# Patient Record
Sex: Male | Born: 1950
Health system: Southern US, Community
[De-identification: ages and names within clinical notes are randomized; demographics above are authoritative.]

## PROBLEM LIST (undated history)

## (undated) DIAGNOSIS — M1990 Unspecified osteoarthritis, unspecified site: Secondary | ICD-10-CM

## (undated) DIAGNOSIS — I10 Essential (primary) hypertension: Secondary | ICD-10-CM

## (undated) DIAGNOSIS — N183 Chronic kidney disease, stage 3 unspecified: Secondary | ICD-10-CM

## (undated) DIAGNOSIS — Z87442 Personal history of urinary calculi: Secondary | ICD-10-CM

## (undated) DIAGNOSIS — I2510 Atherosclerotic heart disease of native coronary artery without angina pectoris: Secondary | ICD-10-CM

## (undated) DIAGNOSIS — K219 Gastro-esophageal reflux disease without esophagitis: Secondary | ICD-10-CM

## (undated) DIAGNOSIS — R7301 Impaired fasting glucose: Secondary | ICD-10-CM

## (undated) DIAGNOSIS — F419 Anxiety disorder, unspecified: Secondary | ICD-10-CM

## (undated) HISTORY — PX: HERNIA REPAIR: SHX51

## (undated) HISTORY — PX: CHOLECYSTECTOMY: SHX55

## (undated) HISTORY — PX: CARDIAC CATHETERIZATION: SHX172

---

## 2017-07-18 DIAGNOSIS — L723 Sebaceous cyst: Secondary | ICD-10-CM | POA: Diagnosis not present

## 2017-07-18 DIAGNOSIS — L602 Onychogryphosis: Secondary | ICD-10-CM | POA: Diagnosis not present

## 2017-07-18 DIAGNOSIS — B353 Tinea pedis: Secondary | ICD-10-CM | POA: Diagnosis not present

## 2017-07-18 DIAGNOSIS — M79672 Pain in left foot: Secondary | ICD-10-CM | POA: Diagnosis not present

## 2017-07-18 DIAGNOSIS — M79671 Pain in right foot: Secondary | ICD-10-CM | POA: Diagnosis not present

## 2017-07-30 DIAGNOSIS — M1000 Idiopathic gout, unspecified site: Secondary | ICD-10-CM | POA: Diagnosis not present

## 2017-07-30 DIAGNOSIS — M1612 Unilateral primary osteoarthritis, left hip: Secondary | ICD-10-CM | POA: Diagnosis not present

## 2017-07-30 DIAGNOSIS — E782 Mixed hyperlipidemia: Secondary | ICD-10-CM | POA: Diagnosis not present

## 2017-07-30 DIAGNOSIS — R7301 Impaired fasting glucose: Secondary | ICD-10-CM | POA: Diagnosis not present

## 2017-07-30 DIAGNOSIS — I10 Essential (primary) hypertension: Secondary | ICD-10-CM | POA: Diagnosis not present

## 2017-07-30 DIAGNOSIS — K210 Gastro-esophageal reflux disease with esophagitis: Secondary | ICD-10-CM | POA: Diagnosis not present

## 2017-07-30 DIAGNOSIS — N183 Chronic kidney disease, stage 3 (moderate): Secondary | ICD-10-CM | POA: Diagnosis not present

## 2017-07-30 DIAGNOSIS — Z8711 Personal history of peptic ulcer disease: Secondary | ICD-10-CM | POA: Diagnosis not present

## 2017-08-01 DIAGNOSIS — M1000 Idiopathic gout, unspecified site: Secondary | ICD-10-CM | POA: Diagnosis not present

## 2017-08-01 DIAGNOSIS — F411 Generalized anxiety disorder: Secondary | ICD-10-CM | POA: Diagnosis not present

## 2017-08-01 DIAGNOSIS — Z1331 Encounter for screening for depression: Secondary | ICD-10-CM | POA: Diagnosis not present

## 2017-08-01 DIAGNOSIS — Z6824 Body mass index (BMI) 24.0-24.9, adult: Secondary | ICD-10-CM | POA: Diagnosis not present

## 2017-08-01 DIAGNOSIS — N401 Enlarged prostate with lower urinary tract symptoms: Secondary | ICD-10-CM | POA: Diagnosis not present

## 2017-08-01 DIAGNOSIS — Z1389 Encounter for screening for other disorder: Secondary | ICD-10-CM | POA: Diagnosis not present

## 2017-08-01 DIAGNOSIS — Z23 Encounter for immunization: Secondary | ICD-10-CM | POA: Diagnosis not present

## 2017-08-01 DIAGNOSIS — E782 Mixed hyperlipidemia: Secondary | ICD-10-CM | POA: Diagnosis not present

## 2017-08-01 DIAGNOSIS — I10 Essential (primary) hypertension: Secondary | ICD-10-CM | POA: Diagnosis not present

## 2017-08-06 DIAGNOSIS — L821 Other seborrheic keratosis: Secondary | ICD-10-CM | POA: Diagnosis not present

## 2017-08-06 DIAGNOSIS — L570 Actinic keratosis: Secondary | ICD-10-CM | POA: Diagnosis not present

## 2017-08-06 DIAGNOSIS — L738 Other specified follicular disorders: Secondary | ICD-10-CM | POA: Diagnosis not present

## 2017-08-28 DIAGNOSIS — N401 Enlarged prostate with lower urinary tract symptoms: Secondary | ICD-10-CM | POA: Diagnosis not present

## 2017-08-28 DIAGNOSIS — Z87442 Personal history of urinary calculi: Secondary | ICD-10-CM | POA: Diagnosis not present

## 2017-08-28 DIAGNOSIS — R351 Nocturia: Secondary | ICD-10-CM | POA: Diagnosis not present

## 2017-08-28 DIAGNOSIS — R3914 Feeling of incomplete bladder emptying: Secondary | ICD-10-CM | POA: Diagnosis not present

## 2017-09-19 DIAGNOSIS — M79672 Pain in left foot: Secondary | ICD-10-CM | POA: Diagnosis not present

## 2017-09-19 DIAGNOSIS — B353 Tinea pedis: Secondary | ICD-10-CM | POA: Diagnosis not present

## 2017-09-19 DIAGNOSIS — M79671 Pain in right foot: Secondary | ICD-10-CM | POA: Diagnosis not present

## 2017-09-19 DIAGNOSIS — L602 Onychogryphosis: Secondary | ICD-10-CM | POA: Diagnosis not present

## 2017-09-19 DIAGNOSIS — L723 Sebaceous cyst: Secondary | ICD-10-CM | POA: Diagnosis not present

## 2017-12-05 DIAGNOSIS — M79672 Pain in left foot: Secondary | ICD-10-CM | POA: Diagnosis not present

## 2017-12-05 DIAGNOSIS — L723 Sebaceous cyst: Secondary | ICD-10-CM | POA: Diagnosis not present

## 2017-12-05 DIAGNOSIS — M79671 Pain in right foot: Secondary | ICD-10-CM | POA: Diagnosis not present

## 2017-12-05 DIAGNOSIS — L602 Onychogryphosis: Secondary | ICD-10-CM | POA: Diagnosis not present

## 2017-12-05 DIAGNOSIS — B353 Tinea pedis: Secondary | ICD-10-CM | POA: Diagnosis not present

## 2017-12-27 DIAGNOSIS — H43812 Vitreous degeneration, left eye: Secondary | ICD-10-CM | POA: Diagnosis not present

## 2017-12-27 DIAGNOSIS — H2513 Age-related nuclear cataract, bilateral: Secondary | ICD-10-CM | POA: Diagnosis not present

## 2017-12-31 DIAGNOSIS — L821 Other seborrheic keratosis: Secondary | ICD-10-CM | POA: Diagnosis not present

## 2017-12-31 DIAGNOSIS — L570 Actinic keratosis: Secondary | ICD-10-CM | POA: Diagnosis not present

## 2017-12-31 DIAGNOSIS — D1801 Hemangioma of skin and subcutaneous tissue: Secondary | ICD-10-CM | POA: Diagnosis not present

## 2017-12-31 DIAGNOSIS — L814 Other melanin hyperpigmentation: Secondary | ICD-10-CM | POA: Diagnosis not present

## 2018-02-04 DIAGNOSIS — E782 Mixed hyperlipidemia: Secondary | ICD-10-CM | POA: Diagnosis not present

## 2018-02-04 DIAGNOSIS — R7301 Impaired fasting glucose: Secondary | ICD-10-CM | POA: Diagnosis not present

## 2018-02-04 DIAGNOSIS — I10 Essential (primary) hypertension: Secondary | ICD-10-CM | POA: Diagnosis not present

## 2018-02-04 DIAGNOSIS — K210 Gastro-esophageal reflux disease with esophagitis: Secondary | ICD-10-CM | POA: Diagnosis not present

## 2018-02-04 DIAGNOSIS — M1000 Idiopathic gout, unspecified site: Secondary | ICD-10-CM | POA: Diagnosis not present

## 2018-02-04 DIAGNOSIS — N183 Chronic kidney disease, stage 3 (moderate): Secondary | ICD-10-CM | POA: Diagnosis not present

## 2018-02-06 DIAGNOSIS — M1000 Idiopathic gout, unspecified site: Secondary | ICD-10-CM | POA: Diagnosis not present

## 2018-02-06 DIAGNOSIS — Z6824 Body mass index (BMI) 24.0-24.9, adult: Secondary | ICD-10-CM | POA: Diagnosis not present

## 2018-02-06 DIAGNOSIS — N401 Enlarged prostate with lower urinary tract symptoms: Secondary | ICD-10-CM | POA: Diagnosis not present

## 2018-02-06 DIAGNOSIS — E782 Mixed hyperlipidemia: Secondary | ICD-10-CM | POA: Diagnosis not present

## 2018-02-06 DIAGNOSIS — I10 Essential (primary) hypertension: Secondary | ICD-10-CM | POA: Diagnosis not present

## 2018-02-06 DIAGNOSIS — N183 Chronic kidney disease, stage 3 (moderate): Secondary | ICD-10-CM | POA: Diagnosis not present

## 2018-02-06 DIAGNOSIS — Z23 Encounter for immunization: Secondary | ICD-10-CM | POA: Diagnosis not present

## 2018-02-06 DIAGNOSIS — M6530 Trigger finger, unspecified finger: Secondary | ICD-10-CM | POA: Diagnosis not present

## 2018-02-13 DIAGNOSIS — M79672 Pain in left foot: Secondary | ICD-10-CM | POA: Diagnosis not present

## 2018-02-13 DIAGNOSIS — L84 Corns and callosities: Secondary | ICD-10-CM | POA: Diagnosis not present

## 2018-02-13 DIAGNOSIS — L723 Sebaceous cyst: Secondary | ICD-10-CM | POA: Diagnosis not present

## 2018-02-13 DIAGNOSIS — B353 Tinea pedis: Secondary | ICD-10-CM | POA: Diagnosis not present

## 2018-02-13 DIAGNOSIS — L602 Onychogryphosis: Secondary | ICD-10-CM | POA: Diagnosis not present

## 2018-02-13 DIAGNOSIS — M79671 Pain in right foot: Secondary | ICD-10-CM | POA: Diagnosis not present

## 2018-03-05 DIAGNOSIS — R351 Nocturia: Secondary | ICD-10-CM | POA: Diagnosis not present

## 2018-03-05 DIAGNOSIS — Z87442 Personal history of urinary calculi: Secondary | ICD-10-CM | POA: Diagnosis not present

## 2018-03-05 DIAGNOSIS — Z6825 Body mass index (BMI) 25.0-25.9, adult: Secondary | ICD-10-CM | POA: Diagnosis not present

## 2018-03-05 DIAGNOSIS — N401 Enlarged prostate with lower urinary tract symptoms: Secondary | ICD-10-CM | POA: Diagnosis not present

## 2018-05-08 DIAGNOSIS — L84 Corns and callosities: Secondary | ICD-10-CM | POA: Diagnosis not present

## 2018-05-08 DIAGNOSIS — M79672 Pain in left foot: Secondary | ICD-10-CM | POA: Diagnosis not present

## 2018-05-08 DIAGNOSIS — L723 Sebaceous cyst: Secondary | ICD-10-CM | POA: Diagnosis not present

## 2018-05-08 DIAGNOSIS — M79671 Pain in right foot: Secondary | ICD-10-CM | POA: Diagnosis not present

## 2018-05-08 DIAGNOSIS — B353 Tinea pedis: Secondary | ICD-10-CM | POA: Diagnosis not present

## 2018-05-08 DIAGNOSIS — L602 Onychogryphosis: Secondary | ICD-10-CM | POA: Diagnosis not present

## 2018-07-01 DIAGNOSIS — D1801 Hemangioma of skin and subcutaneous tissue: Secondary | ICD-10-CM | POA: Diagnosis not present

## 2018-07-01 DIAGNOSIS — H61002 Unspecified perichondritis of left external ear: Secondary | ICD-10-CM | POA: Diagnosis not present

## 2018-07-01 DIAGNOSIS — B360 Pityriasis versicolor: Secondary | ICD-10-CM | POA: Diagnosis not present

## 2018-07-01 DIAGNOSIS — D225 Melanocytic nevi of trunk: Secondary | ICD-10-CM | POA: Diagnosis not present

## 2018-07-01 DIAGNOSIS — B351 Tinea unguium: Secondary | ICD-10-CM | POA: Diagnosis not present

## 2018-07-01 DIAGNOSIS — L821 Other seborrheic keratosis: Secondary | ICD-10-CM | POA: Diagnosis not present

## 2018-07-01 DIAGNOSIS — L570 Actinic keratosis: Secondary | ICD-10-CM | POA: Diagnosis not present

## 2018-07-15 DIAGNOSIS — L820 Inflamed seborrheic keratosis: Secondary | ICD-10-CM | POA: Diagnosis not present

## 2018-07-26 DIAGNOSIS — L602 Onychogryphosis: Secondary | ICD-10-CM | POA: Diagnosis not present

## 2018-07-26 DIAGNOSIS — L723 Sebaceous cyst: Secondary | ICD-10-CM | POA: Diagnosis not present

## 2018-07-26 DIAGNOSIS — L84 Corns and callosities: Secondary | ICD-10-CM | POA: Diagnosis not present

## 2018-07-26 DIAGNOSIS — B353 Tinea pedis: Secondary | ICD-10-CM | POA: Diagnosis not present

## 2018-07-26 DIAGNOSIS — M79672 Pain in left foot: Secondary | ICD-10-CM | POA: Diagnosis not present

## 2018-07-26 DIAGNOSIS — M79671 Pain in right foot: Secondary | ICD-10-CM | POA: Diagnosis not present

## 2018-09-02 DIAGNOSIS — M1000 Idiopathic gout, unspecified site: Secondary | ICD-10-CM | POA: Diagnosis not present

## 2018-09-02 DIAGNOSIS — K210 Gastro-esophageal reflux disease with esophagitis: Secondary | ICD-10-CM | POA: Diagnosis not present

## 2018-09-02 DIAGNOSIS — N183 Chronic kidney disease, stage 3 (moderate): Secondary | ICD-10-CM | POA: Diagnosis not present

## 2018-09-02 DIAGNOSIS — E782 Mixed hyperlipidemia: Secondary | ICD-10-CM | POA: Diagnosis not present

## 2018-09-02 DIAGNOSIS — I10 Essential (primary) hypertension: Secondary | ICD-10-CM | POA: Diagnosis not present

## 2018-09-02 DIAGNOSIS — R7301 Impaired fasting glucose: Secondary | ICD-10-CM | POA: Diagnosis not present

## 2018-09-09 DIAGNOSIS — N401 Enlarged prostate with lower urinary tract symptoms: Secondary | ICD-10-CM | POA: Diagnosis not present

## 2018-09-09 DIAGNOSIS — Z0001 Encounter for general adult medical examination with abnormal findings: Secondary | ICD-10-CM | POA: Diagnosis not present

## 2018-09-09 DIAGNOSIS — I10 Essential (primary) hypertension: Secondary | ICD-10-CM | POA: Diagnosis not present

## 2018-09-09 DIAGNOSIS — R7301 Impaired fasting glucose: Secondary | ICD-10-CM | POA: Diagnosis not present

## 2018-09-09 DIAGNOSIS — E782 Mixed hyperlipidemia: Secondary | ICD-10-CM | POA: Diagnosis not present

## 2018-09-09 DIAGNOSIS — N183 Chronic kidney disease, stage 3 (moderate): Secondary | ICD-10-CM | POA: Diagnosis not present

## 2018-09-09 DIAGNOSIS — Z6824 Body mass index (BMI) 24.0-24.9, adult: Secondary | ICD-10-CM | POA: Diagnosis not present

## 2018-09-09 DIAGNOSIS — M1612 Unilateral primary osteoarthritis, left hip: Secondary | ICD-10-CM | POA: Diagnosis not present

## 2018-10-02 DIAGNOSIS — M79671 Pain in right foot: Secondary | ICD-10-CM | POA: Diagnosis not present

## 2018-10-02 DIAGNOSIS — L602 Onychogryphosis: Secondary | ICD-10-CM | POA: Diagnosis not present

## 2018-10-02 DIAGNOSIS — L723 Sebaceous cyst: Secondary | ICD-10-CM | POA: Diagnosis not present

## 2018-10-02 DIAGNOSIS — L600 Ingrowing nail: Secondary | ICD-10-CM | POA: Diagnosis not present

## 2018-10-02 DIAGNOSIS — L84 Corns and callosities: Secondary | ICD-10-CM | POA: Diagnosis not present

## 2018-10-02 DIAGNOSIS — M79672 Pain in left foot: Secondary | ICD-10-CM | POA: Diagnosis not present

## 2018-11-20 DIAGNOSIS — D485 Neoplasm of uncertain behavior of skin: Secondary | ICD-10-CM | POA: Diagnosis not present

## 2018-11-20 DIAGNOSIS — L570 Actinic keratosis: Secondary | ICD-10-CM | POA: Diagnosis not present

## 2018-11-20 DIAGNOSIS — L821 Other seborrheic keratosis: Secondary | ICD-10-CM | POA: Diagnosis not present

## 2018-11-20 DIAGNOSIS — D229 Melanocytic nevi, unspecified: Secondary | ICD-10-CM | POA: Diagnosis not present

## 2018-11-20 DIAGNOSIS — L820 Inflamed seborrheic keratosis: Secondary | ICD-10-CM | POA: Diagnosis not present

## 2018-11-20 DIAGNOSIS — D1801 Hemangioma of skin and subcutaneous tissue: Secondary | ICD-10-CM | POA: Diagnosis not present

## 2018-11-20 DIAGNOSIS — L738 Other specified follicular disorders: Secondary | ICD-10-CM | POA: Diagnosis not present

## 2018-11-20 DIAGNOSIS — L814 Other melanin hyperpigmentation: Secondary | ICD-10-CM | POA: Diagnosis not present

## 2018-12-11 DIAGNOSIS — L723 Sebaceous cyst: Secondary | ICD-10-CM | POA: Diagnosis not present

## 2018-12-11 DIAGNOSIS — M79671 Pain in right foot: Secondary | ICD-10-CM | POA: Diagnosis not present

## 2018-12-11 DIAGNOSIS — B353 Tinea pedis: Secondary | ICD-10-CM | POA: Diagnosis not present

## 2018-12-11 DIAGNOSIS — L600 Ingrowing nail: Secondary | ICD-10-CM | POA: Diagnosis not present

## 2018-12-11 DIAGNOSIS — L602 Onychogryphosis: Secondary | ICD-10-CM | POA: Diagnosis not present

## 2018-12-11 DIAGNOSIS — M79672 Pain in left foot: Secondary | ICD-10-CM | POA: Diagnosis not present

## 2019-02-11 DIAGNOSIS — Z23 Encounter for immunization: Secondary | ICD-10-CM | POA: Diagnosis not present

## 2019-02-12 DIAGNOSIS — L723 Sebaceous cyst: Secondary | ICD-10-CM | POA: Diagnosis not present

## 2019-02-12 DIAGNOSIS — M79672 Pain in left foot: Secondary | ICD-10-CM | POA: Diagnosis not present

## 2019-02-12 DIAGNOSIS — L84 Corns and callosities: Secondary | ICD-10-CM | POA: Diagnosis not present

## 2019-02-12 DIAGNOSIS — L602 Onychogryphosis: Secondary | ICD-10-CM | POA: Diagnosis not present

## 2019-02-12 DIAGNOSIS — L600 Ingrowing nail: Secondary | ICD-10-CM | POA: Diagnosis not present

## 2019-02-12 DIAGNOSIS — M79671 Pain in right foot: Secondary | ICD-10-CM | POA: Diagnosis not present

## 2019-02-24 DIAGNOSIS — M1000 Idiopathic gout, unspecified site: Secondary | ICD-10-CM | POA: Diagnosis not present

## 2019-02-24 DIAGNOSIS — I10 Essential (primary) hypertension: Secondary | ICD-10-CM | POA: Diagnosis not present

## 2019-02-24 DIAGNOSIS — K2100 Gastro-esophageal reflux disease with esophagitis, without bleeding: Secondary | ICD-10-CM | POA: Diagnosis not present

## 2019-02-24 DIAGNOSIS — E782 Mixed hyperlipidemia: Secondary | ICD-10-CM | POA: Diagnosis not present

## 2019-03-03 DIAGNOSIS — I10 Essential (primary) hypertension: Secondary | ICD-10-CM | POA: Diagnosis not present

## 2019-03-03 DIAGNOSIS — Z23 Encounter for immunization: Secondary | ICD-10-CM | POA: Diagnosis not present

## 2019-03-03 DIAGNOSIS — E782 Mixed hyperlipidemia: Secondary | ICD-10-CM | POA: Diagnosis not present

## 2019-03-03 DIAGNOSIS — N183 Chronic kidney disease, stage 3 unspecified: Secondary | ICD-10-CM | POA: Diagnosis not present

## 2019-03-03 DIAGNOSIS — M1000 Idiopathic gout, unspecified site: Secondary | ICD-10-CM | POA: Diagnosis not present

## 2019-03-03 DIAGNOSIS — N401 Enlarged prostate with lower urinary tract symptoms: Secondary | ICD-10-CM | POA: Diagnosis not present

## 2019-03-03 DIAGNOSIS — Z6823 Body mass index (BMI) 23.0-23.9, adult: Secondary | ICD-10-CM | POA: Diagnosis not present

## 2019-03-03 DIAGNOSIS — R109 Unspecified abdominal pain: Secondary | ICD-10-CM | POA: Diagnosis not present

## 2019-03-10 DIAGNOSIS — N400 Enlarged prostate without lower urinary tract symptoms: Secondary | ICD-10-CM | POA: Diagnosis not present

## 2019-03-10 DIAGNOSIS — Z9049 Acquired absence of other specified parts of digestive tract: Secondary | ICD-10-CM | POA: Diagnosis not present

## 2019-03-10 DIAGNOSIS — N281 Cyst of kidney, acquired: Secondary | ICD-10-CM | POA: Diagnosis not present

## 2019-03-10 DIAGNOSIS — R102 Pelvic and perineal pain: Secondary | ICD-10-CM | POA: Diagnosis not present

## 2019-03-10 DIAGNOSIS — R1084 Generalized abdominal pain: Secondary | ICD-10-CM | POA: Diagnosis not present

## 2019-03-11 DIAGNOSIS — R350 Frequency of micturition: Secondary | ICD-10-CM | POA: Diagnosis not present

## 2019-03-11 DIAGNOSIS — R3912 Poor urinary stream: Secondary | ICD-10-CM | POA: Diagnosis not present

## 2019-03-11 DIAGNOSIS — R351 Nocturia: Secondary | ICD-10-CM | POA: Diagnosis not present

## 2019-03-11 DIAGNOSIS — N401 Enlarged prostate with lower urinary tract symptoms: Secondary | ICD-10-CM | POA: Diagnosis not present

## 2019-03-25 DIAGNOSIS — Z87442 Personal history of urinary calculi: Secondary | ICD-10-CM | POA: Diagnosis not present

## 2019-03-25 DIAGNOSIS — R350 Frequency of micturition: Secondary | ICD-10-CM | POA: Diagnosis not present

## 2019-03-25 DIAGNOSIS — N401 Enlarged prostate with lower urinary tract symptoms: Secondary | ICD-10-CM | POA: Diagnosis not present

## 2019-03-25 DIAGNOSIS — R109 Unspecified abdominal pain: Secondary | ICD-10-CM | POA: Diagnosis not present

## 2019-03-25 DIAGNOSIS — R351 Nocturia: Secondary | ICD-10-CM | POA: Diagnosis not present

## 2019-04-26 DIAGNOSIS — Z20828 Contact with and (suspected) exposure to other viral communicable diseases: Secondary | ICD-10-CM | POA: Diagnosis not present

## 2019-04-26 DIAGNOSIS — R070 Pain in throat: Secondary | ICD-10-CM | POA: Diagnosis not present

## 2019-05-10 DIAGNOSIS — L602 Onychogryphosis: Secondary | ICD-10-CM | POA: Diagnosis not present

## 2019-05-10 DIAGNOSIS — L600 Ingrowing nail: Secondary | ICD-10-CM | POA: Diagnosis not present

## 2019-05-10 DIAGNOSIS — B353 Tinea pedis: Secondary | ICD-10-CM | POA: Diagnosis not present

## 2019-05-10 DIAGNOSIS — M79672 Pain in left foot: Secondary | ICD-10-CM | POA: Diagnosis not present

## 2019-05-10 DIAGNOSIS — M79671 Pain in right foot: Secondary | ICD-10-CM | POA: Diagnosis not present

## 2019-05-10 DIAGNOSIS — L723 Sebaceous cyst: Secondary | ICD-10-CM | POA: Diagnosis not present

## 2019-05-17 DIAGNOSIS — R197 Diarrhea, unspecified: Secondary | ICD-10-CM | POA: Diagnosis not present

## 2019-05-17 DIAGNOSIS — R110 Nausea: Secondary | ICD-10-CM | POA: Diagnosis not present

## 2019-05-17 DIAGNOSIS — Z20822 Contact with and (suspected) exposure to covid-19: Secondary | ICD-10-CM | POA: Diagnosis not present

## 2019-05-28 DIAGNOSIS — D1801 Hemangioma of skin and subcutaneous tissue: Secondary | ICD-10-CM | POA: Diagnosis not present

## 2019-05-28 DIAGNOSIS — L820 Inflamed seborrheic keratosis: Secondary | ICD-10-CM | POA: Diagnosis not present

## 2019-05-28 DIAGNOSIS — L738 Other specified follicular disorders: Secondary | ICD-10-CM | POA: Diagnosis not present

## 2019-05-28 DIAGNOSIS — D229 Melanocytic nevi, unspecified: Secondary | ICD-10-CM | POA: Diagnosis not present

## 2019-05-28 DIAGNOSIS — L570 Actinic keratosis: Secondary | ICD-10-CM | POA: Diagnosis not present

## 2019-05-28 DIAGNOSIS — L821 Other seborrheic keratosis: Secondary | ICD-10-CM | POA: Diagnosis not present

## 2019-06-30 DIAGNOSIS — Z23 Encounter for immunization: Secondary | ICD-10-CM | POA: Diagnosis not present

## 2019-07-10 DIAGNOSIS — R972 Elevated prostate specific antigen [PSA]: Secondary | ICD-10-CM | POA: Diagnosis not present

## 2019-07-10 DIAGNOSIS — D291 Benign neoplasm of prostate: Secondary | ICD-10-CM | POA: Diagnosis not present

## 2019-07-18 DIAGNOSIS — N411 Chronic prostatitis: Secondary | ICD-10-CM | POA: Diagnosis not present

## 2019-07-18 DIAGNOSIS — R3912 Poor urinary stream: Secondary | ICD-10-CM | POA: Diagnosis not present

## 2019-07-18 DIAGNOSIS — N401 Enlarged prostate with lower urinary tract symptoms: Secondary | ICD-10-CM | POA: Diagnosis not present

## 2019-07-18 DIAGNOSIS — R972 Elevated prostate specific antigen [PSA]: Secondary | ICD-10-CM | POA: Diagnosis not present

## 2019-07-18 DIAGNOSIS — R351 Nocturia: Secondary | ICD-10-CM | POA: Diagnosis not present

## 2019-07-18 DIAGNOSIS — Z87442 Personal history of urinary calculi: Secondary | ICD-10-CM | POA: Diagnosis not present

## 2019-07-28 DIAGNOSIS — Z23 Encounter for immunization: Secondary | ICD-10-CM | POA: Diagnosis not present

## 2019-08-06 DIAGNOSIS — L723 Sebaceous cyst: Secondary | ICD-10-CM | POA: Diagnosis not present

## 2019-08-06 DIAGNOSIS — L602 Onychogryphosis: Secondary | ICD-10-CM | POA: Diagnosis not present

## 2019-08-06 DIAGNOSIS — M79671 Pain in right foot: Secondary | ICD-10-CM | POA: Diagnosis not present

## 2019-08-06 DIAGNOSIS — L84 Corns and callosities: Secondary | ICD-10-CM | POA: Diagnosis not present

## 2019-08-06 DIAGNOSIS — L600 Ingrowing nail: Secondary | ICD-10-CM | POA: Diagnosis not present

## 2019-08-06 DIAGNOSIS — M79672 Pain in left foot: Secondary | ICD-10-CM | POA: Diagnosis not present

## 2019-09-01 DIAGNOSIS — K2100 Gastro-esophageal reflux disease with esophagitis, without bleeding: Secondary | ICD-10-CM | POA: Diagnosis not present

## 2019-09-01 DIAGNOSIS — L820 Inflamed seborrheic keratosis: Secondary | ICD-10-CM | POA: Diagnosis not present

## 2019-09-01 DIAGNOSIS — D485 Neoplasm of uncertain behavior of skin: Secondary | ICD-10-CM | POA: Diagnosis not present

## 2019-09-01 DIAGNOSIS — R7301 Impaired fasting glucose: Secondary | ICD-10-CM | POA: Diagnosis not present

## 2019-09-01 DIAGNOSIS — E782 Mixed hyperlipidemia: Secondary | ICD-10-CM | POA: Diagnosis not present

## 2019-09-01 DIAGNOSIS — C44311 Basal cell carcinoma of skin of nose: Secondary | ICD-10-CM | POA: Diagnosis not present

## 2019-09-01 DIAGNOSIS — I10 Essential (primary) hypertension: Secondary | ICD-10-CM | POA: Diagnosis not present

## 2019-09-08 DIAGNOSIS — M1000 Idiopathic gout, unspecified site: Secondary | ICD-10-CM | POA: Diagnosis not present

## 2019-09-08 DIAGNOSIS — I10 Essential (primary) hypertension: Secondary | ICD-10-CM | POA: Diagnosis not present

## 2019-09-08 DIAGNOSIS — Z1331 Encounter for screening for depression: Secondary | ICD-10-CM | POA: Diagnosis not present

## 2019-09-08 DIAGNOSIS — Z1389 Encounter for screening for other disorder: Secondary | ICD-10-CM | POA: Diagnosis not present

## 2019-09-08 DIAGNOSIS — N401 Enlarged prostate with lower urinary tract symptoms: Secondary | ICD-10-CM | POA: Diagnosis not present

## 2019-09-08 DIAGNOSIS — Z6823 Body mass index (BMI) 23.0-23.9, adult: Secondary | ICD-10-CM | POA: Diagnosis not present

## 2019-09-08 DIAGNOSIS — M1612 Unilateral primary osteoarthritis, left hip: Secondary | ICD-10-CM | POA: Diagnosis not present

## 2019-09-16 DIAGNOSIS — C44311 Basal cell carcinoma of skin of nose: Secondary | ICD-10-CM | POA: Diagnosis not present

## 2019-12-01 DIAGNOSIS — H1131 Conjunctival hemorrhage, right eye: Secondary | ICD-10-CM | POA: Diagnosis not present

## 2020-01-05 DIAGNOSIS — B079 Viral wart, unspecified: Secondary | ICD-10-CM | POA: Diagnosis not present

## 2020-01-05 DIAGNOSIS — I788 Other diseases of capillaries: Secondary | ICD-10-CM | POA: Diagnosis not present

## 2020-01-05 DIAGNOSIS — D1801 Hemangioma of skin and subcutaneous tissue: Secondary | ICD-10-CM | POA: Diagnosis not present

## 2020-01-05 DIAGNOSIS — L905 Scar conditions and fibrosis of skin: Secondary | ICD-10-CM | POA: Diagnosis not present

## 2020-01-05 DIAGNOSIS — L570 Actinic keratosis: Secondary | ICD-10-CM | POA: Diagnosis not present

## 2020-01-05 DIAGNOSIS — D485 Neoplasm of uncertain behavior of skin: Secondary | ICD-10-CM | POA: Diagnosis not present

## 2020-01-05 DIAGNOSIS — Z85828 Personal history of other malignant neoplasm of skin: Secondary | ICD-10-CM | POA: Diagnosis not present

## 2020-01-21 DIAGNOSIS — M79672 Pain in left foot: Secondary | ICD-10-CM | POA: Diagnosis not present

## 2020-01-21 DIAGNOSIS — L600 Ingrowing nail: Secondary | ICD-10-CM | POA: Diagnosis not present

## 2020-01-21 DIAGNOSIS — L602 Onychogryphosis: Secondary | ICD-10-CM | POA: Diagnosis not present

## 2020-01-21 DIAGNOSIS — L723 Sebaceous cyst: Secondary | ICD-10-CM | POA: Diagnosis not present

## 2020-01-21 DIAGNOSIS — M79671 Pain in right foot: Secondary | ICD-10-CM | POA: Diagnosis not present

## 2020-01-21 DIAGNOSIS — L84 Corns and callosities: Secondary | ICD-10-CM | POA: Diagnosis not present

## 2020-02-03 DIAGNOSIS — Z23 Encounter for immunization: Secondary | ICD-10-CM | POA: Diagnosis not present

## 2020-02-24 DIAGNOSIS — M1612 Unilateral primary osteoarthritis, left hip: Secondary | ICD-10-CM | POA: Diagnosis not present

## 2020-02-24 DIAGNOSIS — R351 Nocturia: Secondary | ICD-10-CM | POA: Diagnosis not present

## 2020-02-24 DIAGNOSIS — R972 Elevated prostate specific antigen [PSA]: Secondary | ICD-10-CM | POA: Diagnosis not present

## 2020-02-24 DIAGNOSIS — M25552 Pain in left hip: Secondary | ICD-10-CM | POA: Diagnosis not present

## 2020-02-24 DIAGNOSIS — N401 Enlarged prostate with lower urinary tract symptoms: Secondary | ICD-10-CM | POA: Diagnosis not present

## 2020-02-25 DIAGNOSIS — L981 Factitial dermatitis: Secondary | ICD-10-CM | POA: Diagnosis not present

## 2020-03-09 DIAGNOSIS — Z23 Encounter for immunization: Secondary | ICD-10-CM | POA: Diagnosis not present

## 2020-03-15 DIAGNOSIS — M109 Gout, unspecified: Secondary | ICD-10-CM | POA: Diagnosis not present

## 2020-03-15 DIAGNOSIS — K2100 Gastro-esophageal reflux disease with esophagitis, without bleeding: Secondary | ICD-10-CM | POA: Diagnosis not present

## 2020-03-15 DIAGNOSIS — N183 Chronic kidney disease, stage 3 unspecified: Secondary | ICD-10-CM | POA: Diagnosis not present

## 2020-03-15 DIAGNOSIS — E782 Mixed hyperlipidemia: Secondary | ICD-10-CM | POA: Diagnosis not present

## 2020-03-15 DIAGNOSIS — I10 Essential (primary) hypertension: Secondary | ICD-10-CM | POA: Diagnosis not present

## 2020-03-15 DIAGNOSIS — R7301 Impaired fasting glucose: Secondary | ICD-10-CM | POA: Diagnosis not present

## 2020-03-22 DIAGNOSIS — E782 Mixed hyperlipidemia: Secondary | ICD-10-CM | POA: Diagnosis not present

## 2020-03-22 DIAGNOSIS — Z0001 Encounter for general adult medical examination with abnormal findings: Secondary | ICD-10-CM | POA: Diagnosis not present

## 2020-03-22 DIAGNOSIS — M1612 Unilateral primary osteoarthritis, left hip: Secondary | ICD-10-CM | POA: Diagnosis not present

## 2020-03-22 DIAGNOSIS — I10 Essential (primary) hypertension: Secondary | ICD-10-CM | POA: Diagnosis not present

## 2020-03-22 DIAGNOSIS — N401 Enlarged prostate with lower urinary tract symptoms: Secondary | ICD-10-CM | POA: Diagnosis not present

## 2020-03-22 DIAGNOSIS — Z6823 Body mass index (BMI) 23.0-23.9, adult: Secondary | ICD-10-CM | POA: Diagnosis not present

## 2020-03-22 DIAGNOSIS — R7301 Impaired fasting glucose: Secondary | ICD-10-CM | POA: Diagnosis not present

## 2020-04-07 DIAGNOSIS — L84 Corns and callosities: Secondary | ICD-10-CM | POA: Diagnosis not present

## 2020-04-07 DIAGNOSIS — L723 Sebaceous cyst: Secondary | ICD-10-CM | POA: Diagnosis not present

## 2020-04-07 DIAGNOSIS — M79671 Pain in right foot: Secondary | ICD-10-CM | POA: Diagnosis not present

## 2020-04-07 DIAGNOSIS — M79672 Pain in left foot: Secondary | ICD-10-CM | POA: Diagnosis not present

## 2020-04-07 DIAGNOSIS — L600 Ingrowing nail: Secondary | ICD-10-CM | POA: Diagnosis not present

## 2020-04-07 DIAGNOSIS — L602 Onychogryphosis: Secondary | ICD-10-CM | POA: Diagnosis not present

## 2020-04-12 DIAGNOSIS — M1612 Unilateral primary osteoarthritis, left hip: Secondary | ICD-10-CM | POA: Diagnosis not present

## 2020-04-12 DIAGNOSIS — M25552 Pain in left hip: Secondary | ICD-10-CM | POA: Diagnosis not present

## 2020-05-17 ENCOUNTER — Other Ambulatory Visit: Payer: Self-pay | Admitting: Orthopaedic Surgery

## 2020-05-17 DIAGNOSIS — Z01818 Encounter for other preprocedural examination: Secondary | ICD-10-CM

## 2020-05-31 DIAGNOSIS — C44329 Squamous cell carcinoma of skin of other parts of face: Secondary | ICD-10-CM | POA: Diagnosis not present

## 2020-05-31 DIAGNOSIS — Z85828 Personal history of other malignant neoplasm of skin: Secondary | ICD-10-CM | POA: Diagnosis not present

## 2020-05-31 DIAGNOSIS — L905 Scar conditions and fibrosis of skin: Secondary | ICD-10-CM | POA: Diagnosis not present

## 2020-05-31 DIAGNOSIS — D485 Neoplasm of uncertain behavior of skin: Secondary | ICD-10-CM | POA: Diagnosis not present

## 2020-05-31 DIAGNOSIS — L738 Other specified follicular disorders: Secondary | ICD-10-CM | POA: Diagnosis not present

## 2020-05-31 DIAGNOSIS — L821 Other seborrheic keratosis: Secondary | ICD-10-CM | POA: Diagnosis not present

## 2020-05-31 DIAGNOSIS — L570 Actinic keratosis: Secondary | ICD-10-CM | POA: Diagnosis not present

## 2020-05-31 DIAGNOSIS — D1801 Hemangioma of skin and subcutaneous tissue: Secondary | ICD-10-CM | POA: Diagnosis not present

## 2020-05-31 DIAGNOSIS — R202 Paresthesia of skin: Secondary | ICD-10-CM | POA: Diagnosis not present

## 2020-05-31 DIAGNOSIS — L812 Freckles: Secondary | ICD-10-CM | POA: Diagnosis not present

## 2020-06-08 DIAGNOSIS — M1612 Unilateral primary osteoarthritis, left hip: Secondary | ICD-10-CM | POA: Diagnosis not present

## 2020-06-08 DIAGNOSIS — R7301 Impaired fasting glucose: Secondary | ICD-10-CM | POA: Diagnosis not present

## 2020-06-08 DIAGNOSIS — E7849 Other hyperlipidemia: Secondary | ICD-10-CM | POA: Diagnosis not present

## 2020-06-08 DIAGNOSIS — Z6824 Body mass index (BMI) 24.0-24.9, adult: Secondary | ICD-10-CM | POA: Diagnosis not present

## 2020-06-08 DIAGNOSIS — K2100 Gastro-esophageal reflux disease with esophagitis, without bleeding: Secondary | ICD-10-CM | POA: Diagnosis not present

## 2020-06-08 DIAGNOSIS — M109 Gout, unspecified: Secondary | ICD-10-CM | POA: Diagnosis not present

## 2020-06-08 DIAGNOSIS — I10 Essential (primary) hypertension: Secondary | ICD-10-CM | POA: Diagnosis not present

## 2020-06-08 DIAGNOSIS — N183 Chronic kidney disease, stage 3 unspecified: Secondary | ICD-10-CM | POA: Diagnosis not present

## 2020-06-08 NOTE — Care Plan (Signed)
Ortho Bundle Case Management Note  Patient Details  Name: Luke Wood MRN: [id number] Date of Birth: 04/22/1951  Spoke with patient prior to surgery. He will discharge to home with brother in [place] for several days. He will do home exercises there and start OPPT at [facility] once he returns to [place]. rollling walker ordered for home use. Patient and MD in agreement with plan. Choice offered.                    DME Arranged:  [place] [facility] rolling DME Agency:  [employer]  HH Arranged:    [place] Agency:     Additional Comments: Please contact me with any questions of if this plan should need to change.  Ladell Heads,  [place] Specialist  [phone] 06/08/2020, 8:55 AM

## 2020-06-08 NOTE — Progress Notes (Signed)
DUE TO COVID-19 ONLY ONE VISITOR IS ALLOWED TO COME WITH YOU AND STAY IN THE WAITING ROOM ONLY DURING PRE OP AND PROCEDURE DAY OF SURGERY. THE 1 VISITOR  MAY VISIT WITH YOU AFTER SURGERY IN YOUR PRIVATE ROOM DURING VISITING HOURS ONLY!  YOU NEED TO HAVE A COVID 19 TEST ON__2/18/2022 _____ @___1150am  ____, THIS TEST MUST BE DONE BEFORE SURGERY,  COVID TESTING SITE [address] [place] [address], IT IS ON THE RIGHT GOING OUT [address] APPROXIMATELY  2 MINUTES PAST [employer] ON THE RIGHT. ONCE YOUR COVID TEST IS COMPLETED,  PLEASE BEGIN THE QUARANTINE INSTRUCTIONS AS OUTLINED IN YOUR HANDOUT.                Luke Wood  06/08/2020   Your procedure is scheduled on: 06/15/2020   Report to [facility] Main  Entrance   Report to admitting at   0720am  AM     Call this number if you have problems the morning of surgery [phone]    REMEMBER: NO  SOLID FOOD CANDY OR GUM AFTER MIDNIGHT. CLEAR LIQUIDS UNTIL  0650 am        . NOTHING BY MOUTH EXCEPT CLEAR LIQUIDS UNTIL    . PLEASE FINISH ENSURE DRINK PER SURGEON ORDER  WHICH NEEDS TO BE COMPLETED AT  0650am     .      CLEAR LIQUID DIET   Foods Allowed                                                                    Coffee and tea, regular and decaf                            Fruit ices (not with fruit pulp)                                      Iced Popsicles                                    Carbonated beverages, regular and diet                                    Cranberry, grape and apple juices Sports drinks like Gatorade Lightly seasoned clear broth or consume(fat free) Sugar, honey syrup ___________________________________________________________________      BRUSH YOUR TEETH MORNING OF SURGERY AND RINSE YOUR MOUTH OUT, NO CHEWING GUM CANDY OR MINTS.     Take these medicines the morning of surgery with A SIP OF WATER: allegra, pepcid, flomax   DO NOT TAKE ANY DIABETIC MEDICATIONS DAY OF  YOUR SURGERY                               You may not have any metal on your body including hair pins and              piercings  Do not wear jewelry, make-up, lotions, powders or perfumes, deodorant             Do not wear nail polish on your fingernails.  Do not shave  48 hours prior to surgery.              Men may shave face and neck.   Do not bring valuables to the hospital. [place].  Contacts, dentures or bridgework may not be worn into surgery.  Leave suitcase in the car. After surgery it may be brought to your room.     Patients discharged the day of surgery will not be allowed to drive home. IF YOU ARE HAVING SURGERY AND GOING HOME THE SAME DAY, YOU MUST HAVE AN ADULT TO DRIVE YOU HOME AND BE WITH YOU FOR 24 HOURS. YOU MAY GO HOME BY TAXI OR UBER OR ORTHERWISE, BUT AN ADULT MUST ACCOMPANY YOU HOME AND STAY WITH YOU FOR 24 HOURS.  Name and phone number of your driver:  Special Instructions: N/A              Please read over the following fact sheets you were given: _____________________________________________________________________  [facility] - Preparing for Surgery Before surgery, you can play an important role.  Because skin is not sterile, your skin needs to be as free of germs as possible.  You can reduce the number of germs on your skin by washing with CHG (chlorahexidine gluconate) soap before surgery.  CHG is an antiseptic cleaner which kills germs and bonds with the skin to continue killing germs even after washing. Please DO NOT use if you have an allergy to CHG or antibacterial soaps.  If your skin becomes reddened/irritated stop using the CHG and inform your nurse when you arrive at Short Stay. Do not shave (including legs and underarms) for at least 48 hours prior to the first CHG shower.  You may shave your face/neck. Please follow these instructions carefully:  1.  Shower with CHG Soap the night before surgery and  the  morning of Surgery.  2.  If you choose to wash your hair, wash your hair first as usual with your  normal  shampoo.  3.  After you shampoo, rinse your hair and body thoroughly to remove the  shampoo.                           4.  Use CHG as you would any other liquid soap.  You can apply chg directly  to the skin and wash                       Gently with a scrungie or clean washcloth.  5.  Apply the CHG Soap to your body ONLY FROM THE NECK DOWN.   Do not use on face/ open                           Wound or open sores. Avoid contact with eyes, ears mouth and genitals (private parts).                       Wash face,  Genitals (private parts) with your normal soap.             6.  Wash  thoroughly, paying special attention to the area where your surgery  will be performed.  7.  Thoroughly rinse your body with warm water from the neck down.  8.  DO NOT shower/wash with your normal soap after using and rinsing off  the CHG Soap.                9.  Pat yourself dry with a clean towel.            10.  Wear clean pajamas.            11.  Place clean sheets on your bed the night of your first shower and do not  sleep with pets. Day of Surgery : Do not apply any lotions/deodorants the morning of surgery.  Please wear clean clothes to the hospital/surgery center.  FAILURE TO FOLLOW THESE INSTRUCTIONS MAY RESULT IN THE CANCELLATION OF YOUR SURGERY PATIENT SIGNATURE_________________________________  NURSE SIGNATURE__________________________________  ________________________________________________________________________

## 2020-06-11 ENCOUNTER — Ambulatory Visit (HOSPITAL_COMMUNITY)
Admission: RE | Admit: 2020-06-11 | Discharge: 2020-06-11 | Disposition: A | Discharge status: Home / Self Care | Payer: Medicare Other | Source: Ambulatory Visit | Attending: Orthopaedic Surgery | Admitting: Orthopaedic Surgery

## 2020-06-11 ENCOUNTER — Other Ambulatory Visit: Payer: Self-pay

## 2020-06-11 ENCOUNTER — Encounter (HOSPITAL_COMMUNITY)
Admission: RE | Admit: 2020-06-11 | Discharge: 2020-06-11 | Disposition: A | Discharge status: Home / Self Care | Payer: Medicare Other | Source: Ambulatory Visit | Attending: Orthopaedic Surgery | Admitting: Orthopaedic Surgery

## 2020-06-11 ENCOUNTER — Encounter (HOSPITAL_COMMUNITY): Payer: Self-pay

## 2020-06-11 ENCOUNTER — Other Ambulatory Visit (HOSPITAL_COMMUNITY)
Admission: RE | Admit: 2020-06-11 | Discharge: 2020-06-11 | Disposition: A | Discharge status: Home / Self Care | Payer: Medicare Other | Source: Ambulatory Visit | Attending: Orthopaedic Surgery | Admitting: Orthopaedic Surgery

## 2020-06-11 DIAGNOSIS — Z20822 Contact with and (suspected) exposure to covid-19: Secondary | ICD-10-CM | POA: Diagnosis not present

## 2020-06-11 DIAGNOSIS — Z01818 Encounter for other preprocedural examination: Secondary | ICD-10-CM

## 2020-06-11 DIAGNOSIS — I10 Essential (primary) hypertension: Secondary | ICD-10-CM | POA: Diagnosis not present

## 2020-06-11 HISTORY — DX: Chronic kidney disease, stage 3 unspecified: N18.30

## 2020-06-11 HISTORY — DX: Unspecified osteoarthritis, unspecified site: M19.90

## 2020-06-11 HISTORY — DX: Impaired fasting glucose: R73.01

## 2020-06-11 HISTORY — DX: Essential (primary) hypertension: I10

## 2020-06-11 HISTORY — DX: Anxiety disorder, unspecified: F41.9

## 2020-06-11 LAB — URINALYSIS, ROUTINE W REFLEX MICROSCOPIC
Bilirubin Urine: NEGATIVE
Glucose, UA: NEGATIVE mg/dL
Hgb urine dipstick: NEGATIVE
Ketones, ur: NEGATIVE mg/dL
Leukocytes,Ua: NEGATIVE
Nitrite: NEGATIVE
Protein, ur: 30 mg/dL — AB
Specific Gravity, Urine: 1.013 (ref 1.005–1.030)
pH: 5 (ref 5.0–8.0)

## 2020-06-11 LAB — CBC WITH DIFFERENTIAL/PLATELET
Abs Immature Granulocytes: 0.01 10*3/uL (ref 0.00–0.07)
Basophils Absolute: 0 10*3/uL (ref 0.0–0.1)
Basophils Relative: 1 %
Eosinophils Absolute: 0.1 10*3/uL (ref 0.0–0.5)
Eosinophils Relative: 1 %
HCT: 42.5 % (ref 39.0–52.0)
Hemoglobin: 14.2 g/dL (ref 13.0–17.0)
Immature Granulocytes: 0 %
Lymphocytes Relative: 11 %
Lymphs Abs: 0.9 10*3/uL (ref 0.7–4.0)
MCH: 29.9 pg (ref 26.0–34.0)
MCHC: 33.4 g/dL (ref 30.0–36.0)
MCV: 89.5 fL (ref 80.0–100.0)
Monocytes Absolute: 0.4 10*3/uL (ref 0.1–1.0)
Monocytes Relative: 5 %
Neutro Abs: 6.4 10*3/uL (ref 1.7–7.7)
Neutrophils Relative %: 82 %
Platelets: 181 10*3/uL (ref 150–400)
RBC: 4.75 MIL/uL (ref 4.22–5.81)
RDW: 13.9 % (ref 11.5–15.5)
WBC: 7.9 10*3/uL (ref 4.0–10.5)
nRBC: 0 % (ref 0.0–0.2)

## 2020-06-11 LAB — BASIC METABOLIC PANEL
Anion gap: 9 (ref 5–15)
BUN: 22 mg/dL (ref 8–23)
CO2: 25 mmol/L (ref 22–32)
Calcium: 9.3 mg/dL (ref 8.9–10.3)
Chloride: 104 mmol/L (ref 98–111)
Creatinine, Ser: 1.35 mg/dL — ABNORMAL HIGH (ref 0.61–1.24)
GFR, Estimated: 58 mL/min — ABNORMAL LOW (ref >60)
Glucose, Bld: 104 mg/dL — ABNORMAL HIGH (ref 70–99)
Potassium: 4.1 mmol/L (ref 3.5–5.1)
Sodium: 138 mmol/L (ref 135–145)

## 2020-06-11 LAB — PROTIME-INR
INR: 1 (ref 0.8–1.2)
Prothrombin Time: 13.2 seconds (ref 11.4–15.2)

## 2020-06-11 LAB — APTT: aPTT: 33 seconds (ref 24–36)

## 2020-06-11 LAB — SURGICAL PCR SCREEN
MRSA, PCR: NEGATIVE
Staphylococcus aureus: NEGATIVE

## 2020-06-11 NOTE — Progress Notes (Addendum)
      Anesthesia Review:  PCP:  Dr Consuello Masse, [place]- 06/08/2020 on chart Labs 03/15/20 on chart  Office visit note 06/08/20 on chart Cardiologist : Chest x-ray :06/11/2020  EKG : 06/08/2020 on chart  Echo : Stress test: Cardiac Cath :  Activity level: can do a flight of stairs without difficulty  Sleep Study/ CPAP : no  Fasting Blood Sugar :      / Checks Blood Sugar -- times a day:   Blood Thinner/ Instructions /Last Dose: ASA / Instructions/ Last Dose :  U/A done 2/18 routed to DR dalldorf.

## 2020-06-12 LAB — SARS CORONAVIRUS 2 (TAT 6-24 HRS): SARS Coronavirus 2: NEGATIVE

## 2020-06-14 ENCOUNTER — Encounter (HOSPITAL_COMMUNITY): Payer: Self-pay

## 2020-06-14 MED ORDER — TRANEXAMIC ACID 1000 MG/10ML IV SOLN
2000.0000 mg | INTRAVENOUS | Status: DC
  Filled 2020-06-14: qty 20

## 2020-06-14 NOTE — Progress Notes (Signed)
Anesthesia Chart Review:   Case: [id number] Date/Time: 06/15/20 0715   Procedure: LEFT TOTAL HIP ARTHROPLASTY ANTERIOR APPROACH (Left Hip)   Anesthesia type: Spinal   Pre-op diagnosis: LEFT HIP DEGENERATIVE JOINT DISEASE   Location: [facility] ROOM 06 / [facility]   Surgeons: Melrose Nakayama, MD      DISCUSSION:  Pt is a 70 year old with hx HTN   VS: BP (!) 149/89   Pulse 70   Temp 37.1 C (Oral)   Resp 16   SpO2 99%   PROVIDERS: - PCP is Sasser, Silvestre Moment, MD who cleared pt for surgery noting he is optimized from a medical and cardiac standpoint. Last office visit 06/08/20   LABS: Labs reviewed: Acceptable for surgery. (all labs ordered are listed, but only abnormal results are displayed)  Labs Reviewed  BASIC METABOLIC PANEL - Abnormal; Notable for the following components:      Result Value   Glucose, Bld 104 (*)    Creatinine, Ser 1.35 (*)    GFR, Estimated 58 (*)    All other components within normal limits  URINALYSIS, ROUTINE W REFLEX MICROSCOPIC - Abnormal; Notable for the following components:   Protein, ur 30 (*)    Bacteria, UA RARE (*)    All other components within normal limits  SURGICAL PCR SCREEN  CBC WITH DIFFERENTIAL/PLATELET  PROTIME-INR  APTT  TYPE AND SCREEN     IMAGES: CXR 06/11/20: No acute abnormalities.   EKG 06/08/20 (Dr. Edythe Lynn office):NSR. RBBB.    CV: N/A    Past Medical History:  Diagnosis Date  . Anxiety   . Arthritis   . Hypertension     Past Surgical History:  Procedure Laterality Date  . CARDIAC CATHETERIZATION    . CHOLECYSTECTOMY    . HERNIA REPAIR      MEDICATIONS: . acetaminophen (TYLENOL) 325 MG tablet  . atenolol (TENORMIN) 50 MG tablet  . benazepril (LOTENSIN) 20 MG tablet  . famotidine (PEPCID) 20 MG tablet  . Flaxseed, Linseed, (FLAXSEED OIL) 1200 MG CAPS  . Magnesium 250 MG TABS  . Multiple Vitamins-Minerals (MULTIVITAMIN WITH MINERALS) tablet  . probenecid (BENEMID) 500 MG tablet  . Red Yeast Rice 600 MG CAPS   . tamsulosin (FLOMAX) 0.4 MG CAPS capsule  . vitamin B-12 (CYANOCOBALAMIN) 1000 MCG tablet   No current facility-administered medications for this encounter.   Derrill Memo ON 06/15/2020] tranexamic acid (CYKLOKAPRON) 2,000 mg in sodium chloride 0.9 % 50 mL Topical Application    If no changes, I anticipate pt can proceed with surgery as scheduled.   Willeen Cass, PhD, FNP-BC [facility] [address] [facility]/Anesthesiology Phone: [phone] 06/14/2020 9:00 AM

## 2020-06-14 NOTE — H&P (Signed)
TOTAL HIP ADMISSION H&P  Patient is admitted for left total hip arthroplasty.  Subjective:  Chief Complaint: left hip pain  HPI: Luke Wood, 70 y.o. male, has a history of pain and functional disability in the left hip(s) due to arthritis and patient has failed non-surgical conservative treatments for greater than 12 weeks to include NSAID's and/or analgesics, flexibility and strengthening excercises, use of assistive devices, weight reduction as appropriate and activity modification.  Onset of symptoms was gradual starting 5 years ago with gradually worsening course since that time.The patient noted no past surgery on the left hip(s).  Patient currently rates pain in the left hip at 10 out of 10 with activity. Patient has night pain, worsening of pain with activity and weight bearing, trendelenberg gait, pain that interfers with activities of daily living and crepitus. Patient has evidence of subchondral cysts, subchondral sclerosis, periarticular osteophytes and joint space narrowing by imaging studies. This condition presents safety issues increasing the risk of falls. There is no current active infection.  There are no problems to display for this patient.  Past Medical History:  Diagnosis Date  . Anxiety   . Arthritis   . Chronic kidney disease (CKD), stage III (moderate) (HCC)   . Hypertension   . Impaired fasting glucose     Past Surgical History:  Procedure Laterality Date  . CARDIAC CATHETERIZATION    . CHOLECYSTECTOMY    . HERNIA REPAIR      Current Facility-Administered Medications  Medication Dose Route Frequency Provider Last Rate Last Admin  . [START ON 06/15/2020] tranexamic acid (CYKLOKAPRON) 2,000 mg in sodium chloride 0.9 % 50 mL Topical Application  8,315 mg Topical To OR Melrose Nakayama, MD       Current Outpatient Medications  Medication Sig Dispense Refill Last Dose  . acetaminophen (TYLENOL) 325 MG tablet Take 325 mg by mouth every 6 (six) hours as needed for  moderate pain.     Marland Kitchen atenolol (TENORMIN) 50 MG tablet Take 50 mg by mouth daily.     . benazepril (LOTENSIN) 20 MG tablet Take 20 mg by mouth at bedtime.     . famotidine (PEPCID) 20 MG tablet Take 20 mg by mouth daily.     . Flaxseed, Linseed, (FLAXSEED OIL) 1200 MG CAPS Take 1,200 mg by mouth daily.     . Magnesium 250 MG TABS Take 250 mg by mouth daily.     . Multiple Vitamins-Minerals (MULTIVITAMIN WITH MINERALS) tablet Take 1 tablet by mouth daily.     . probenecid (BENEMID) 500 MG tablet Take 250 mg by mouth daily.     . Red Yeast Rice 600 MG CAPS Take by mouth.     . tamsulosin (FLOMAX) 0.4 MG CAPS capsule Take 0.4 mg by mouth daily.     . vitamin B-12 (CYANOCOBALAMIN) 1000 MCG tablet Take 1,000 mcg by mouth daily.      No Known Allergies  Social History   Tobacco Use  . Smoking status: Never Smoker  . Smokeless tobacco: Never Used  Substance Use Topics  . Alcohol use: Never    No family history on file.   Review of Systems  Musculoskeletal: Positive for arthralgias.       Left hip  All other systems reviewed and are negative.   Objective:  Physical Exam Constitutional:      Appearance: Normal appearance.  HENT:     Head: Normocephalic and atraumatic.     Nose: Nose normal.     Mouth/Throat:  Pharynx: Oropharynx is clear.  Eyes:     Extraocular Movements: Extraocular movements intact.  Cardiovascular:     Rate and Rhythm: Normal rate and regular rhythm.  Pulmonary:     Effort: Pulmonary effort is normal.  Abdominal:     Palpations: Abdomen is soft.  Musculoskeletal:     Comments: Left hip motion is limited and extremely painful.  Luke Wood walks with a markedly altered gait.  Sensation and motor function are intact distally and has palpable pulses in his feet.  Opposite hip moves well.    Skin:    General: Skin is warm and dry.  Neurological:     General: No focal deficit present.     Mental Status: Luke Wood is alert and oriented to person, place, and time.   Psychiatric:        Mood and Affect: Mood normal.        Behavior: Behavior normal.        Thought Content: Thought content normal.        Judgment: Judgment normal.     Vital signs in last 24 hours:    Labs:   There is no height or weight on file to calculate BMI.   Imaging Review Plain radiographs demonstrate severe degenerative joint disease of the left hip(s). The bone quality appears to be good for age and reported activity level.      Assessment/Plan:  End stage primary arthritis, left hip(s)  The patient history, physical examination, clinical judgement of the provider and imaging studies are consistent with end stage degenerative joint disease of the left hip(s) and total hip arthroplasty is deemed medically necessary. The treatment options including medical management, injection therapy, arthroscopy and arthroplasty were discussed at length. The risks and benefits of total hip arthroplasty were presented and reviewed. The risks due to aseptic loosening, infection, stiffness, dislocation/subluxation,  thromboembolic complications and other imponderables were discussed.  The patient acknowledged the explanation, agreed to proceed with the plan and consent was signed. Patient is being admitted for inpatient treatment for surgery, pain control, PT, OT, prophylactic antibiotics, VTE prophylaxis, progressive ambulation and ADL's and discharge planning.The patient is planning to be discharged home with home health services.

## 2020-06-14 NOTE — Anesthesia Preprocedure Evaluation (Addendum)
Anesthesia Evaluation  Patient identified by MRN, date of birth, ID band Patient awake    Reviewed: Allergy & Precautions, NPO status , Patient's Chart, lab work & pertinent test results  History of Anesthesia Complications Negative for: history of anesthetic complications  Airway Mallampati: II  TM Distance: >3 FB Neck ROM: Full    Dental no notable dental hx. (+) Dental Advisory Given   Pulmonary neg pulmonary ROS,    Pulmonary exam normal        Cardiovascular hypertension, Pt. on home beta blockers and Pt. on medications Normal cardiovascular exam     Neuro/Psych negative neurological ROS     GI/Hepatic negative GI ROS, Neg liver ROS,   Endo/Other  negative endocrine ROS  Renal/GU Renal InsufficiencyRenal disease     Musculoskeletal negative musculoskeletal ROS (+)   Abdominal   Peds  Hematology negative hematology ROS (+)   Anesthesia Other Findings   Reproductive/Obstetrics                           Anesthesia Physical Anesthesia Plan  ASA: II  Anesthesia Plan: Spinal   Post-op Pain Management:    Induction:   PONV Risk Score and Plan: Ondansetron and Propofol infusion  Airway Management Planned: Natural Airway  Additional Equipment:   Intra-op Plan:   Post-operative Plan:   Informed Consent: I have reviewed the patients History and Physical, chart, labs and discussed the procedure including the risks, benefits and alternatives for the proposed anesthesia with the patient or authorized representative who has indicated his/her understanding and acceptance.     Dental advisory given  Plan Discussed with: Anesthesiologist  Anesthesia Plan Comments: (See APP note by Durel Salts, FNP )      Anesthesia Quick Evaluation

## 2020-06-15 ENCOUNTER — Ambulatory Visit (HOSPITAL_COMMUNITY): Payer: Medicare Other

## 2020-06-15 ENCOUNTER — Ambulatory Visit (HOSPITAL_COMMUNITY)
Admission: RE | Admit: 2020-06-15 | Discharge: 2020-06-15 | Disposition: A | Discharge status: Home / Self Care | Payer: Medicare Other | Attending: Orthopaedic Surgery | Admitting: Orthopaedic Surgery

## 2020-06-15 ENCOUNTER — Encounter (HOSPITAL_COMMUNITY)
Admission: RE | Disposition: A | Discharge status: Home / Self Care | Payer: Self-pay | Source: Home / Self Care | Attending: Orthopaedic Surgery

## 2020-06-15 ENCOUNTER — Ambulatory Visit (HOSPITAL_COMMUNITY): Payer: Medicare Other | Admitting: Certified Registered Nurse Anesthetist

## 2020-06-15 ENCOUNTER — Encounter (HOSPITAL_COMMUNITY): Payer: Self-pay | Admitting: Orthopaedic Surgery

## 2020-06-15 ENCOUNTER — Ambulatory Visit (HOSPITAL_COMMUNITY): Payer: Medicare Other | Admitting: Emergency Medicine

## 2020-06-15 DIAGNOSIS — M1612 Unilateral primary osteoarthritis, left hip: Secondary | ICD-10-CM | POA: Diagnosis not present

## 2020-06-15 DIAGNOSIS — F419 Anxiety disorder, unspecified: Secondary | ICD-10-CM | POA: Diagnosis not present

## 2020-06-15 DIAGNOSIS — Z471 Aftercare following joint replacement surgery: Secondary | ICD-10-CM | POA: Diagnosis not present

## 2020-06-15 DIAGNOSIS — Z79899 Other long term (current) drug therapy: Secondary | ICD-10-CM | POA: Insufficient documentation

## 2020-06-15 DIAGNOSIS — Z96642 Presence of left artificial hip joint: Secondary | ICD-10-CM | POA: Diagnosis not present

## 2020-06-15 DIAGNOSIS — Z419 Encounter for procedure for purposes other than remedying health state, unspecified: Secondary | ICD-10-CM

## 2020-06-15 DIAGNOSIS — N183 Chronic kidney disease, stage 3 unspecified: Secondary | ICD-10-CM | POA: Diagnosis not present

## 2020-06-15 DIAGNOSIS — I129 Hypertensive chronic kidney disease with stage 1 through stage 4 chronic kidney disease, or unspecified chronic kidney disease: Secondary | ICD-10-CM | POA: Diagnosis not present

## 2020-06-15 HISTORY — PX: TOTAL HIP ARTHROPLASTY: SHX124

## 2020-06-15 LAB — TYPE AND SCREEN
ABO/RH(D): AB POS
Antibody Screen: NEGATIVE

## 2020-06-15 LAB — ABO/RH: ABO/RH(D): AB POS

## 2020-06-15 SURGERY — ARTHROPLASTY, HIP, TOTAL, ANTERIOR APPROACH
Anesthesia: Spinal | Site: Hip | Laterality: Left

## 2020-06-15 MED ORDER — CEFAZOLIN SODIUM-DEXTROSE 2-4 GM/100ML-% IV SOLN
INTRAVENOUS | Status: AC
  Filled 2020-06-15: qty 100

## 2020-06-15 MED ORDER — PROPOFOL 500 MG/50ML IV EMUL
INTRAVENOUS | Status: DC | PRN
  Administered 2020-06-15: 30 ug/kg/min via INTRAVENOUS

## 2020-06-15 MED ORDER — ONDANSETRON HCL 4 MG/2ML IJ SOLN: 4.0000 mg | Freq: Four times a day (QID) | INTRAMUSCULAR | Status: DC | PRN

## 2020-06-15 MED ORDER — LACTATED RINGERS IV BOLUS
500.0000 mL | Freq: Once | INTRAVENOUS | Status: AC
  Administered 2020-06-15: 500 mL via INTRAVENOUS

## 2020-06-15 MED ORDER — ACETAMINOPHEN 500 MG PO TABS: 1000.0000 mg | ORAL_TABLET | Freq: Once | ORAL | Status: AC

## 2020-06-15 MED ORDER — AMISULPRIDE (ANTIEMETIC) 5 MG/2ML IV SOLN: 10.0000 mg | Freq: Once | INTRAVENOUS | Status: DC | PRN

## 2020-06-15 MED ORDER — METOCLOPRAMIDE HCL 5 MG PO TABS
5.0000 mg | ORAL_TABLET | Freq: Three times a day (TID) | ORAL | Status: DC | PRN
  Filled 2020-06-15: qty 2

## 2020-06-15 MED ORDER — PROPOFOL 1000 MG/100ML IV EMUL
INTRAVENOUS | Status: AC
  Filled 2020-06-15: qty 200

## 2020-06-15 MED ORDER — PHENYLEPHRINE 40 MCG/ML (10ML) SYRINGE FOR IV PUSH (FOR BLOOD PRESSURE SUPPORT)
PREFILLED_SYRINGE | INTRAVENOUS | Status: AC
  Filled 2020-06-15: qty 10

## 2020-06-15 MED ORDER — ACETAMINOPHEN 500 MG PO TABS
ORAL_TABLET | ORAL | Status: AC
  Administered 2020-06-15: 1000 mg via ORAL
  Filled 2020-06-15: qty 2

## 2020-06-15 MED ORDER — CEFAZOLIN SODIUM-DEXTROSE 2-4 GM/100ML-% IV SOLN
2.0000 g | INTRAVENOUS | Status: AC
  Administered 2020-06-15: 2 g via INTRAVENOUS

## 2020-06-15 MED ORDER — ACETAMINOPHEN 500 MG PO TABS: 500.0000 mg | ORAL_TABLET | Freq: Four times a day (QID) | ORAL | Status: DC

## 2020-06-15 MED ORDER — FENTANYL CITRATE (PF) 100 MCG/2ML IJ SOLN: 25.0000 ug | INTRAMUSCULAR | Status: DC | PRN

## 2020-06-15 MED ORDER — ACETAMINOPHEN 325 MG PO TABS: 325.0000 mg | ORAL_TABLET | Freq: Four times a day (QID) | ORAL | Status: DC | PRN

## 2020-06-15 MED ORDER — BUPIVACAINE IN DEXTROSE 0.75-8.25 % IT SOLN
INTRATHECAL | Status: DC | PRN
  Administered 2020-06-15: 1.8 mL via INTRATHECAL

## 2020-06-15 MED ORDER — PROPOFOL 10 MG/ML IV BOLUS
INTRAVENOUS | Status: AC
  Filled 2020-06-15: qty 40

## 2020-06-15 MED ORDER — ONDANSETRON HCL 4 MG PO TABS
4.0000 mg | ORAL_TABLET | Freq: Four times a day (QID) | ORAL | Status: DC | PRN
  Filled 2020-06-15: qty 1

## 2020-06-15 MED ORDER — TRANEXAMIC ACID-NACL 1000-0.7 MG/100ML-% IV SOLN
INTRAVENOUS | Status: AC
  Filled 2020-06-15: qty 100

## 2020-06-15 MED ORDER — TAMSULOSIN HCL 0.4 MG PO CAPS
0.4000 mg | ORAL_CAPSULE | Freq: Once | ORAL | Status: AC
  Administered 2020-06-15: 0.4 mg via ORAL
  Filled 2020-06-15: qty 1

## 2020-06-15 MED ORDER — HYDROCODONE-ACETAMINOPHEN 7.5-325 MG PO TABS: 1.0000 | ORAL_TABLET | ORAL | Status: DC | PRN

## 2020-06-15 MED ORDER — BUPIVACAINE-MELOXICAM ER 400-12 MG/14ML IJ SOLN
INTRAMUSCULAR | Status: DC | PRN
Start: 2020-06-15 — End: 2020-06-15
  Administered 2020-06-15: 400 mg

## 2020-06-15 MED ORDER — DEXAMETHASONE SODIUM PHOSPHATE 10 MG/ML IJ SOLN
INTRAMUSCULAR | Status: AC
  Filled 2020-06-15: qty 1

## 2020-06-15 MED ORDER — PHENYLEPHRINE 40 MCG/ML (10ML) SYRINGE FOR IV PUSH (FOR BLOOD PRESSURE SUPPORT)
PREFILLED_SYRINGE | INTRAVENOUS | Status: DC | PRN
  Administered 2020-06-15 (×5): 80 ug via INTRAVENOUS

## 2020-06-15 MED ORDER — MORPHINE SULFATE (PF) 4 MG/ML IV SOLN: 0.5000 mg | INTRAVENOUS | Status: DC | PRN

## 2020-06-15 MED ORDER — METHOCARBAMOL 500 MG IVPB - SIMPLE MED
500.0000 mg | Freq: Four times a day (QID) | INTRAVENOUS | Status: DC | PRN
  Administered 2020-06-15: 500 mg via INTRAVENOUS

## 2020-06-15 MED ORDER — BUPIVACAINE-EPINEPHRINE (PF) 0.25% -1:200000 IJ SOLN
INTRAMUSCULAR | Status: DC | PRN
  Administered 2020-06-15: 30 mL

## 2020-06-15 MED ORDER — TRANEXAMIC ACID-NACL 1000-0.7 MG/100ML-% IV SOLN
1000.0000 mg | INTRAVENOUS | Status: AC
  Administered 2020-06-15: 1000 mg via INTRAVENOUS

## 2020-06-15 MED ORDER — CELECOXIB 200 MG PO CAPS: 200.0000 mg | ORAL_CAPSULE | Freq: Once | ORAL | Status: AC

## 2020-06-15 MED ORDER — LACTATED RINGERS IV SOLN: INTRAVENOUS | Status: DC

## 2020-06-15 MED ORDER — KETOROLAC TROMETHAMINE 15 MG/ML IJ SOLN
INTRAMUSCULAR | Status: AC
  Administered 2020-06-15: 7.5 mg via INTRAVENOUS
  Filled 2020-06-15: qty 1

## 2020-06-15 MED ORDER — CEFAZOLIN SODIUM-DEXTROSE 2-4 GM/100ML-% IV SOLN: 2.0000 g | Freq: Four times a day (QID) | INTRAVENOUS | Status: DC

## 2020-06-15 MED ORDER — POVIDONE-IODINE 10 % EX SWAB
2.0000 "application " | Freq: Once | CUTANEOUS | Status: AC
  Administered 2020-06-15: 2 via TOPICAL

## 2020-06-15 MED ORDER — ONDANSETRON HCL 4 MG/2ML IJ SOLN
INTRAMUSCULAR | Status: AC
  Filled 2020-06-15: qty 2

## 2020-06-15 MED ORDER — METOCLOPRAMIDE HCL 5 MG/ML IJ SOLN: 5.0000 mg | Freq: Three times a day (TID) | INTRAMUSCULAR | Status: DC | PRN

## 2020-06-15 MED ORDER — STERILE WATER FOR IRRIGATION IR SOLN
Status: DC | PRN
  Administered 2020-06-15: 2000 mL

## 2020-06-15 MED ORDER — LACTATED RINGERS IV BOLUS
250.0000 mL | Freq: Once | INTRAVENOUS | Status: AC
  Administered 2020-06-15: 250 mL via INTRAVENOUS

## 2020-06-15 MED ORDER — PROPOFOL 10 MG/ML IV BOLUS
INTRAVENOUS | Status: DC | PRN
  Administered 2020-06-15: 50 ug/kg/min via INTRAVENOUS

## 2020-06-15 MED ORDER — 0.9 % SODIUM CHLORIDE (POUR BTL) OPTIME
TOPICAL | Status: DC | PRN
  Administered 2020-06-15: 1000 mL

## 2020-06-15 MED ORDER — TRANEXAMIC ACID-NACL 1000-0.7 MG/100ML-% IV SOLN: 1000.0000 mg | Freq: Once | INTRAVENOUS | Status: DC

## 2020-06-15 MED ORDER — BUPIVACAINE-EPINEPHRINE (PF) 0.25% -1:200000 IJ SOLN
INTRAMUSCULAR | Status: AC
  Filled 2020-06-15: qty 30

## 2020-06-15 MED ORDER — ONDANSETRON HCL 4 MG/2ML IJ SOLN
INTRAMUSCULAR | Status: DC | PRN
  Administered 2020-06-15: 4 mg via INTRAVENOUS

## 2020-06-15 MED ORDER — BUPIVACAINE LIPOSOME 1.3 % IJ SUSP
10.0000 mL | Freq: Once | INTRAMUSCULAR | Status: DC
  Filled 2020-06-15: qty 10

## 2020-06-15 MED ORDER — PHENYLEPHRINE HCL-NACL 10-0.9 MG/250ML-% IV SOLN
INTRAVENOUS | Status: DC | PRN
  Administered 2020-06-15: 25 ug/min via INTRAVENOUS

## 2020-06-15 MED ORDER — METHOCARBAMOL 500 MG IVPB - SIMPLE MED
INTRAVENOUS | Status: AC
  Filled 2020-06-15: qty 50

## 2020-06-15 MED ORDER — MIDAZOLAM HCL 5 MG/5ML IJ SOLN
INTRAMUSCULAR | Status: DC | PRN
  Administered 2020-06-15: 2 mg via INTRAVENOUS

## 2020-06-15 MED ORDER — KETOROLAC TROMETHAMINE 15 MG/ML IJ SOLN: 7.5000 mg | Freq: Four times a day (QID) | INTRAMUSCULAR | Status: DC

## 2020-06-15 MED ORDER — METHOCARBAMOL 500 MG PO TABS: 500.0000 mg | ORAL_TABLET | Freq: Four times a day (QID) | ORAL | Status: DC | PRN

## 2020-06-15 MED ORDER — MIDAZOLAM HCL 2 MG/2ML IJ SOLN
INTRAMUSCULAR | Status: AC
  Filled 2020-06-15: qty 2

## 2020-06-15 MED ORDER — HYDROCODONE-ACETAMINOPHEN 5-325 MG PO TABS: 1.0000 | ORAL_TABLET | ORAL | Status: DC | PRN

## 2020-06-15 MED ORDER — CELECOXIB 200 MG PO CAPS
ORAL_CAPSULE | ORAL | Status: AC
  Administered 2020-06-15: 200 mg via ORAL
  Filled 2020-06-15: qty 2

## 2020-06-15 SURGICAL SUPPLY — 47 items
BAG DECANTER FOR FLEXI CONT (MISCELLANEOUS) ×2 IMPLANT
BLADE SAW SGTL 18X1.27X75 (BLADE) ×2 IMPLANT
BOOTIES KNEE HIGH SLOAN (MISCELLANEOUS) ×2 IMPLANT
CELLS DAT CNTRL 66122 CELL SVR (MISCELLANEOUS) ×1 IMPLANT
COVER PERINEAL POST (MISCELLANEOUS) ×2 IMPLANT
COVER SURGICAL LIGHT HANDLE (MISCELLANEOUS) ×2 IMPLANT
COVER WAND RF STERILE (DRAPES) IMPLANT
CUP ACETABULAR GRIPTON 100 52 (Orthopedic Implant) ×1 IMPLANT
DECANTER SPIKE VIAL GLASS SM (MISCELLANEOUS) ×2 IMPLANT
DRAPE IMP U-DRAPE 54X76 (DRAPES) ×2 IMPLANT
DRAPE ORTHO SPLIT 77X108 STRL (DRAPES)
DRAPE STERI IOBAN 125X83 (DRAPES) ×2 IMPLANT
DRAPE SURG ORHT 6 SPLT 77X108 (DRAPES) IMPLANT
DRAPE U-SHAPE 47X51 STRL (DRAPES) ×4 IMPLANT
DRSG AQUACEL AG ADV 3.5X 6 (GAUZE/BANDAGES/DRESSINGS) ×2 IMPLANT
DURAPREP 26ML APPLICATOR (WOUND CARE) ×2 IMPLANT
ELECT BLADE TIP CTD 4 INCH (ELECTRODE) ×2 IMPLANT
ELECT REM PT RETURN 15FT ADLT (MISCELLANEOUS) ×2 IMPLANT
ELIMINATOR HOLE APEX DEPUY (Hips) ×2 IMPLANT
FACESHIELD WRAPAROUND (MASK) ×4 IMPLANT
GLOVE SRG 8 PF TXTR STRL LF DI (GLOVE) ×2 IMPLANT
GLOVE SURG ENC MOIS LTX SZ8 (GLOVE) ×4 IMPLANT
GLOVE SURG UNDER POLY LF SZ8 (GLOVE) ×2
GOWN STRL REUS W/TWL XL LVL3 (GOWN DISPOSABLE) ×4 IMPLANT
GRIPTON 100 52 (Orthopedic Implant) ×2 IMPLANT
HEAD M SROM 36MM 2 (Hips) ×1 IMPLANT
HOLDER FOLEY CATH W/STRAP (MISCELLANEOUS) ×2 IMPLANT
KIT TURNOVER KIT A (KITS) ×2 IMPLANT
LINER NEUTRAL 52X36MM PLUS 4 (Liner) ×2 IMPLANT
MANIFOLD NEPTUNE II (INSTRUMENTS) ×2 IMPLANT
NEEDLE HYPO 22GX1.5 SAFETY (NEEDLE) ×2 IMPLANT
NS IRRIG 1000ML POUR BTL (IV SOLUTION) ×2 IMPLANT
PACK ANTERIOR HIP CUSTOM (KITS) ×2 IMPLANT
PENCIL SMOKE EVACUATOR (MISCELLANEOUS) IMPLANT
PROTECTOR NERVE ULNAR (MISCELLANEOUS) ×2 IMPLANT
RTRCTR WOUND ALEXIS 18CM MED (MISCELLANEOUS) ×2
SROM M HEAD 36MM 2 (Hips) ×2 IMPLANT
STEM FEMORAL SZ5 HIGH ACTIS (Stem) ×2 IMPLANT
SUT ETHIBOND NAB CT1 #1 30IN (SUTURE) ×4 IMPLANT
SUT VIC AB 1 CT1 36 (SUTURE) ×2 IMPLANT
SUT VIC AB 2-0 CT1 27 (SUTURE) ×1
SUT VIC AB 2-0 CT1 TAPERPNT 27 (SUTURE) ×1 IMPLANT
SUT VICRYL AB 3-0 FS1 BRD 27IN (SUTURE) ×2 IMPLANT
SUT VLOC 180 0 24IN GS25 (SUTURE) ×2 IMPLANT
SYR 50ML LL SCALE MARK (SYRINGE) ×2 IMPLANT
TRAY FOLEY MTR SLVR 16FR STAT (SET/KITS/TRAYS/PACK) ×2 IMPLANT
TUBE SUCTION HIGH CAP CLEAR NV (SUCTIONS) ×2 IMPLANT

## 2020-06-15 NOTE — Anesthesia Procedure Notes (Signed)
Spinal  Patient location during procedure: OR Start time: 06/15/2020 7:28 AM End time: 06/15/2020 7:31 AM Staffing Performed: resident/CRNA  Anesthesiologist: Duane Boston, MD Resident/CRNA: West Pugh, CRNA Preanesthetic Checklist Completed: patient identified, IV checked, site marked, risks and benefits discussed, surgical consent, monitors and equipment checked, pre-op evaluation and timeout performed Spinal Block Patient position: sitting Prep: DuraPrep and site prepped and draped Patient monitoring: heart rate, continuous pulse ox and blood pressure Approach: midline Location: L3-4 Injection technique: single-shot Needle Needle type: Pencan and Introducer  Needle gauge: 24 G Needle length: 10 cm Assessment Sensory level: T4 Additional Notes IV functioning, monitors applied to pt. Expiration date of kit checked and confirmed to be in date. Sterile prep and drape, hand hygiene and sterile gloved used. Pt was positioned and spine was prepped in sterile fashion. Skin was anesthetized with lidocaine. Free flow of clear CSF obtained prior to injecting local anesthetic into CSF x 1 attempt. Spinal needle aspirated freely following injection. Needle was carefully withdrawn, and pt tolerated procedure well. Loss of motor and sensory on exam post injection. Dr Tobias Alexander present for procedure.

## 2020-06-15 NOTE — Evaluation (Signed)
Physical Therapy Evaluation Patient Details Name: Luke Wood MRN: [id number] DOB: [birth year] Today's Date: 06/15/2020   History of Present Illness  Patient is a 70 y.o. male s/p L THA on 06/15/2020 with PMH significant for HTN, CKD (stage III), OA, and anxiety.  Clinical Impression  Pt is a 70y.o. male s/p Lt THA POD 0. Pt reports that he is modified independent with use of SPC for mobility. Pt required MIN guard and verbal cues for sit to stand transfers. Pt required MIN guard progressing to supervision for ambulation 10ft with cues for RW management and step to gait pattern with no LOB. Pt was able to safely perform stair negotiation with MIN guard and cues for sequencing. PT reviewed HEP handout and therapeutic intervention for promotion of DVT prevention, pt demonstrated understanding. Pt is currently at a safe mobility level for discharge. Recommend home with family support. Pt will be staying with his brother upon discharge. Pt will benefit from skilled PT to increase independence and safety with mobility.      Follow Up Recommendations Home health PT;Follow surgeon's recommendation for DC plan and follow-up therapies    Equipment Recommendations  Rolling walker with 5" wheels    Recommendations for Other Services       Precautions / Restrictions Precautions Precautions: Fall Restrictions Weight Bearing Restrictions: No Other Position/Activity Restrictions: WBAT      Mobility  Bed Mobility Overal bed mobility: Needs Assistance Bed Mobility: Supine to Sit     Supine to sit: Supervision;HOB elevated     General bed mobility comments: use of B UEs to scoot to EOB with supervision for safety.    Transfers Overall transfer level: Needs assistance Equipment used: Rolling walker (2 wheeled) Transfers: Sit to/from Stand Sit to Stand: Min guard         General transfer comment: MIN guard for safety with cues for safe hand placement and review of WBAT  status.  Ambulation/Gait Ambulation/Gait assistance: Min guard;Supervision Gait Distance (Feet): 90 Feet Assistive device: Rolling walker (2 wheeled) Gait Pattern/deviations: Step-to pattern;Decreased stride length;Decreased weight shift to left Gait velocity: decr   General Gait Details: MIN guard progressing to supervision for safety with cues for step to gait pattern and RW managament.  Stairs Stairs: Yes Stairs assistance: Min guard Stair Management: One rail Right;Forwards Number of Stairs: 3 General stair comments: MIN guard for safety with cues for sequencing "up with the good, down with the bad". PT educated pt on safe guarding position for family members at home.  Wheelchair Mobility    Modified Rankin (Stroke Patients Only)       Balance Overall balance assessment: Needs assistance Sitting-balance support: Feet supported Sitting balance-Leahy Scale: Good     Standing balance support: During functional activity;Bilateral upper extremity supported Standing balance-Leahy Scale: Poor Standing balance comment: use of RW to maintain standing balance                             Pertinent Vitals/Pain Pain Assessment: 0-10 Pain Score: 4  Pain Location: Lt hip Pain Descriptors / Indicators: Numbness;Discomfort;Sore;Sharp Pain Intervention(s): Limited activity within patient's tolerance;Monitored during session;Repositioned    Home Living Family/patient expects to be discharged to:: Private residence Living Arrangements: Alone Available Help at Discharge: Family Type of Home: House Home Access: Stairs to enter Entrance Stairs-Rails: Right Entrance Stairs-Number of Steps: 2 Home Layout: One level [place] - single point Additional Comments: home environemnt is pt'sbrother's house. Pt  will be staying with his brother for few days then going to a handicapped friendly hotel. Pt lives alone on 3rd floor apartment normally.    Prior Function  Level of Independence: Independent with assistive device(s)         Comments: use of SPC     Hand Dominance   Dominant Hand: Right    Extremity/Trunk Assessment   Upper Extremity Assessment Upper Extremity Assessment: Overall WFL for tasks assessed    Lower Extremity Assessment Lower Extremity Assessment: LLE deficits/detail LLE Deficits / Details: pt with good quad set strength and and 4/5 B dorsi/plantar flexion strength. LLE Sensation: WNL (numbness in proximal hip region) LLE Coordination: WNL    Cervical / Trunk Assessment Cervical / Trunk Assessment: Normal  Communication   Communication: No difficulties  Cognition Arousal/Alertness: Awake/alert Behavior During Therapy: Anxious;WFL for tasks assessed/performed Overall Cognitive Status: Within Functional Limits for tasks assessed                                        General Comments      Exercises Total Joint Exercises Ankle Circles/Pumps: AROM;Both;20 reps;Seated Quad Sets: AROM;Left;5 reps;Seated Short Arc Quad: AAROM;Left;5 reps;Seated Heel Slides: AROM;Left;5 reps;Seated Hip ABduction/ADduction: AROM;Left;5 reps;Seated   Assessment/Plan    PT Assessment Patient needs continued PT services  PT Problem List Decreased strength;Decreased range of motion;Decreased activity tolerance;Decreased balance;Decreased mobility;Decreased knowledge of use of DME;Pain       PT Treatment Interventions DME instruction;Gait training;Stair training;Functional mobility training;Therapeutic activities;Therapeutic exercise;Balance training;Patient/family education    PT Goals (Current goals can be found in the Care Plan section)  Acute Rehab PT Goals Patient Stated Goal: get back to hiking PT Goal Formulation: With patient Time For Goal Achievement: 06/22/20 Potential to Achieve Goals: Good    Frequency 7X/week   Barriers to discharge        Co-evaluation               AM-PAC PT "6  Clicks" Mobility  Outcome Measure Help needed turning from your back to your side while in a flat bed without using bedrails?: None Help needed moving from lying on your back to sitting on the side of a flat bed without using bedrails?: A Little Help needed moving to and from a bed to a chair (including a wheelchair)?: A Little Help needed standing up from a chair using your arms (e.g., wheelchair or bedside chair)?: A Little Help needed to walk in hospital room?: A Little Help needed climbing 3-5 steps with a railing? : A Little 6 Click Score: 19    End of Session Equipment Utilized During Treatment: Gait belt Activity Tolerance: Patient tolerated treatment well Patient left: in chair;with call bell/phone within reach Nurse Communication: Mobility status (pt still needs to void) PT Visit Diagnosis: Unsteadiness on feet (R26.81);Muscle weakness (generalized) (M62.81);Pain Pain - Right/Left: Left Pain - part of body: Hip    Time: 1638-4665 PT Time Calculation (min) (ACUTE ONLY): 39 min   Charges:              Elna Breslow, SPT  Acute rehab    Elna Breslow 06/15/2020, 5:12 PM

## 2020-06-15 NOTE — Op Note (Signed)
PRE-OP DIAGNOSIS:  LEFT HIP DEGENERATIVE JOINT DISEASE POST-OP DIAGNOSIS: same PROCEDURE:  LEFT TOTAL HIP ARTHROPLASTY ANTERIOR APPROACH ANESTHESIA:  Spinal and MAC SURGEON:  Melrose Nakayama MD ASSISTANT:  Loni Dolly PA-C   INDICATIONS FOR PROCEDURE:  The patient is a 70 y.o. male with a long history of a painful hip.  This has persisted despite multiple conservative measures.  The patient has persisted with pain and dysfunction making rest and activity difficult.  A total hip replacement is offered as surgical treatment.  Informed operative consent was obtained after discussion of possible complications including reaction to anesthesia, infection, neurovascular injury, dislocation, DVT, PE, and death.  The importance of the postoperative rehab program to optimize result was stressed with the patient.  SUMMARY OF FINDINGS AND PROCEDURE:  Under the above anesthesia through a anterior approach an the Hana table a left THR was performed.  The patient had severe degenerative change and good bone quality.  We used DePuy components to replace the hip and these were size 5 high offset Actis femur capped with a -2 91m metal hip ball.  On the acetabular side we used a size 52 Gription shell with a plus 4 polyethylene liner.  We did use a hole eliminator.  ALoni DollyPA-C assisted throughout and was invaluable to the completion of the case in that he helped position and retract while I performed the procedure.  He also closed simultaneously to help minimize OR time.  I used fluoroscopy throughout the case to check position of components and leg lengths and read all these views myself.  DESCRIPTION OF PROCEDURE:  The patient was taken to the OR suite where the above anesthetic was applied.  The patient was then positioned on the Hana table supine.  All bony prominences were appropriately padded.  Prep and drape was then performed in normal sterile fashion.  The patient was given kefzol preoperative antibiotic and  an appropriate time out was performed.  We then took an anterior approach to the left hip.  Dissection was taken through adipose to the tensor fascia lata fascia.  This structure was incised longitudinally and we dissected in the intermuscular interval just medial to this muscle.  Cobra retractors were placed superior and inferior to the femoral neck superficial to the capsule.  A capsular incision was then made and the retractors were placed along the femoral neck.  Xray was brought in to get a good level for the femoral neck cut which was made with an oscillating saw and osteotome.  The femoral head was removed with a corkscrew.  The acetabulum was exposed and some labral tissues were excised. Reaming was taken to the inside wall of the pelvis and sequentially up to 1 mm smaller than the actual component.  A trial of components was done and then the aforementioned acetabular shell was placed in appropriate tilt and anteversion confirmed by fluoroscopy. The liner was placed along with the hole eliminator and attention was turned to the femur.  The leg was brought down and over into adduction and the elevator bar was used to raise the femur up gently in the wound.  The piriformis was released with care taken to preserve the obturator internus attachment and all of the posterior capsule. The femur was reamed and then broached to the appropriate size.  A trial reduction was done and the aforementioned head and neck assembly gave u[place]oreathe best stability in extension with external rotation.  Leg lengths were felt to be about equal by  fluoroscopic exam.  The trial components were removed and the wound irrigated.  We then placed the femoral component in appropriate anteversion.  The head was applied to a dry stem neck and the hip again reduced.  It was again stable in the aforementioned position.  The would was irrigated again followed by re-approximation of anterior capsule with ethibond suture. Tensor fascia was repaired  with V-loc suture  followed by deep closure with #O and #2 undyed vicryl.  Skin was closed with subQ stitch and steristrips followed by a sterile dressing.  EBL and IOF can be obtained from anesthesia records.  DISPOSITION:  The patient was extubated in the OR and taken to PACU in stable condition to be admitted to the Orthopedic Surgery for appropriate post-op care to include perioperative antibiotics and DVT prophylaxis.

## 2020-06-15 NOTE — Interval H&P Note (Signed)
History and Physical Interval Note:  06/15/2020 7:23 AM  Luke Wood  has presented today for surgery, with the diagnosis of LEFT HIP DEGENERATIVE JOINT DISEASE.  The various methods of treatment have been discussed with the patient and family. After consideration of risks, benefits and other options for treatment, the patient has consented to  Procedure(s): LEFT TOTAL HIP ARTHROPLASTY ANTERIOR APPROACH (Left) as a surgical intervention.  The patient's history has been reviewed, patient examined, no change in status, stable for surgery.  I have reviewed the patient's chart and labs.  Questions were answered to the patient's satisfaction.     Hessie Dibble

## 2020-06-15 NOTE — Transfer of Care (Signed)
Immediate Anesthesia Transfer of Care Note  Patient: Luke Wood  Procedure(s) Performed: LEFT TOTAL HIP ARTHROPLASTY ANTERIOR APPROACH (Left Hip)  Patient Location: PACU  Anesthesia Type:MAC and Spinal  Level of Consciousness: awake, alert , drowsy and patient cooperative  Airway & Oxygen Therapy: Patient Spontanous Breathing and Patient connected to face mask oxygen  Post-op Assessment: Report given to RN and Post -op Vital signs reviewed and stable  Post vital signs: Reviewed and stable  Last Vitals:  Vitals Value Taken Time  BP 105/75 06/15/20 0915  Temp    Pulse 80 06/15/20 0916  Resp 18 06/15/20 0916  SpO2 100 % 06/15/20 0916  Vitals shown include unvalidated device data.  Last Pain:  Vitals:   06/15/20 0552  TempSrc: Oral         Complications: No complications documented.

## 2020-06-15 NOTE — Progress Notes (Signed)
Pt unable to void after several attempts. Contacted Loni Dolly, [place]. Orders received for pt's home dose of Flomax. Pharmacy notified we will need this sent to PACU tube station. Coolidge Breeze, RN 06/15/2020

## 2020-06-15 NOTE — Anesthesia Postprocedure Evaluation (Signed)
Anesthesia Post Note  Patient: Luke Wood  Procedure(s) Performed: LEFT TOTAL HIP ARTHROPLASTY ANTERIOR APPROACH (Left Hip)     Patient location during evaluation: PACU Anesthesia Type: Spinal Level of consciousness: awake and alert Pain management: pain level controlled Vital Signs Assessment: post-procedure vital signs reviewed and stable Respiratory status: spontaneous breathing and respiratory function stable Cardiovascular status: blood pressure returned to baseline and stable Postop Assessment: spinal receding Anesthetic complications: no   No complications documented.  Last Vitals:  Vitals:   06/15/20 1100 06/15/20 1300  BP: (!) 154/101 (!) 142/92  Pulse: 60   Resp:    Temp:    SpO2: 100% 99%    Last Pain:  Vitals:   06/15/20 1300  TempSrc:   PainSc: 2                  Chadric Kimberley DANIEL

## 2020-06-15 NOTE — Anesthesia Procedure Notes (Addendum)
Procedure Name: MAC Date/Time: 06/15/2020 7:26 AM Performed by: West Pugh, CRNA Pre-anesthesia Checklist: Patient identified, Emergency Drugs available, Suction available, Patient being monitored and Timeout performed Patient Re-evaluated:Patient Re-evaluated prior to induction Oxygen Delivery Method: Simple face mask Preoxygenation: Pre-oxygenation with 100% oxygen Induction Type: IV induction Placement Confirmation: positive ETCO2 Dental Injury: Teeth and Oropharynx as per pre-operative assessment

## 2020-06-17 ENCOUNTER — Encounter (HOSPITAL_COMMUNITY): Payer: Self-pay | Admitting: Orthopaedic Surgery

## 2020-06-21 DIAGNOSIS — R262 Difficulty in walking, not elsewhere classified: Secondary | ICD-10-CM | POA: Diagnosis not present

## 2020-06-21 DIAGNOSIS — M25552 Pain in left hip: Secondary | ICD-10-CM | POA: Diagnosis not present

## 2020-06-21 DIAGNOSIS — Z471 Aftercare following joint replacement surgery: Secondary | ICD-10-CM | POA: Diagnosis not present

## 2020-06-21 DIAGNOSIS — M6281 Muscle weakness (generalized): Secondary | ICD-10-CM | POA: Diagnosis not present

## 2020-06-22 DIAGNOSIS — M25552 Pain in left hip: Secondary | ICD-10-CM | POA: Diagnosis not present

## 2020-06-22 DIAGNOSIS — R262 Difficulty in walking, not elsewhere classified: Secondary | ICD-10-CM | POA: Diagnosis not present

## 2020-06-22 DIAGNOSIS — M6281 Muscle weakness (generalized): Secondary | ICD-10-CM | POA: Diagnosis not present

## 2020-06-22 DIAGNOSIS — Z471 Aftercare following joint replacement surgery: Secondary | ICD-10-CM | POA: Diagnosis not present

## 2020-06-24 DIAGNOSIS — R262 Difficulty in walking, not elsewhere classified: Secondary | ICD-10-CM | POA: Diagnosis not present

## 2020-06-24 DIAGNOSIS — M6281 Muscle weakness (generalized): Secondary | ICD-10-CM | POA: Diagnosis not present

## 2020-06-24 DIAGNOSIS — M25552 Pain in left hip: Secondary | ICD-10-CM | POA: Diagnosis not present

## 2020-06-24 DIAGNOSIS — Z471 Aftercare following joint replacement surgery: Secondary | ICD-10-CM | POA: Diagnosis not present

## 2020-06-25 DIAGNOSIS — Z471 Aftercare following joint replacement surgery: Secondary | ICD-10-CM | POA: Diagnosis not present

## 2020-06-25 DIAGNOSIS — Z96642 Presence of left artificial hip joint: Secondary | ICD-10-CM | POA: Diagnosis not present

## 2020-06-28 DIAGNOSIS — M6281 Muscle weakness (generalized): Secondary | ICD-10-CM | POA: Diagnosis not present

## 2020-06-28 DIAGNOSIS — Z471 Aftercare following joint replacement surgery: Secondary | ICD-10-CM | POA: Diagnosis not present

## 2020-06-28 DIAGNOSIS — M25552 Pain in left hip: Secondary | ICD-10-CM | POA: Diagnosis not present

## 2020-06-28 DIAGNOSIS — R262 Difficulty in walking, not elsewhere classified: Secondary | ICD-10-CM | POA: Diagnosis not present

## 2020-06-30 DIAGNOSIS — M25552 Pain in left hip: Secondary | ICD-10-CM | POA: Diagnosis not present

## 2020-06-30 DIAGNOSIS — Z471 Aftercare following joint replacement surgery: Secondary | ICD-10-CM | POA: Diagnosis not present

## 2020-06-30 DIAGNOSIS — M6281 Muscle weakness (generalized): Secondary | ICD-10-CM | POA: Diagnosis not present

## 2020-06-30 DIAGNOSIS — R262 Difficulty in walking, not elsewhere classified: Secondary | ICD-10-CM | POA: Diagnosis not present

## 2020-07-01 DIAGNOSIS — Z471 Aftercare following joint replacement surgery: Secondary | ICD-10-CM | POA: Diagnosis not present

## 2020-07-01 DIAGNOSIS — M6281 Muscle weakness (generalized): Secondary | ICD-10-CM | POA: Diagnosis not present

## 2020-07-01 DIAGNOSIS — M25552 Pain in left hip: Secondary | ICD-10-CM | POA: Diagnosis not present

## 2020-07-01 DIAGNOSIS — R262 Difficulty in walking, not elsewhere classified: Secondary | ICD-10-CM | POA: Diagnosis not present

## 2020-07-05 DIAGNOSIS — M25552 Pain in left hip: Secondary | ICD-10-CM | POA: Diagnosis not present

## 2020-07-05 DIAGNOSIS — Z471 Aftercare following joint replacement surgery: Secondary | ICD-10-CM | POA: Diagnosis not present

## 2020-07-05 DIAGNOSIS — M6281 Muscle weakness (generalized): Secondary | ICD-10-CM | POA: Diagnosis not present

## 2020-07-05 DIAGNOSIS — R262 Difficulty in walking, not elsewhere classified: Secondary | ICD-10-CM | POA: Diagnosis not present

## 2020-07-06 DIAGNOSIS — M6281 Muscle weakness (generalized): Secondary | ICD-10-CM | POA: Diagnosis not present

## 2020-07-06 DIAGNOSIS — Z471 Aftercare following joint replacement surgery: Secondary | ICD-10-CM | POA: Diagnosis not present

## 2020-07-06 DIAGNOSIS — M25552 Pain in left hip: Secondary | ICD-10-CM | POA: Diagnosis not present

## 2020-07-06 DIAGNOSIS — R262 Difficulty in walking, not elsewhere classified: Secondary | ICD-10-CM | POA: Diagnosis not present

## 2020-07-09 DIAGNOSIS — M25552 Pain in left hip: Secondary | ICD-10-CM | POA: Diagnosis not present

## 2020-07-09 DIAGNOSIS — M6281 Muscle weakness (generalized): Secondary | ICD-10-CM | POA: Diagnosis not present

## 2020-07-09 DIAGNOSIS — Z471 Aftercare following joint replacement surgery: Secondary | ICD-10-CM | POA: Diagnosis not present

## 2020-07-09 DIAGNOSIS — R262 Difficulty in walking, not elsewhere classified: Secondary | ICD-10-CM | POA: Diagnosis not present

## 2020-07-13 DIAGNOSIS — M25552 Pain in left hip: Secondary | ICD-10-CM | POA: Diagnosis not present

## 2020-07-13 DIAGNOSIS — R262 Difficulty in walking, not elsewhere classified: Secondary | ICD-10-CM | POA: Diagnosis not present

## 2020-07-13 DIAGNOSIS — Z471 Aftercare following joint replacement surgery: Secondary | ICD-10-CM | POA: Diagnosis not present

## 2020-07-13 DIAGNOSIS — M6281 Muscle weakness (generalized): Secondary | ICD-10-CM | POA: Diagnosis not present

## 2020-07-15 DIAGNOSIS — M6281 Muscle weakness (generalized): Secondary | ICD-10-CM | POA: Diagnosis not present

## 2020-07-15 DIAGNOSIS — Z471 Aftercare following joint replacement surgery: Secondary | ICD-10-CM | POA: Diagnosis not present

## 2020-07-15 DIAGNOSIS — M25552 Pain in left hip: Secondary | ICD-10-CM | POA: Diagnosis not present

## 2020-07-15 DIAGNOSIS — R262 Difficulty in walking, not elsewhere classified: Secondary | ICD-10-CM | POA: Diagnosis not present

## 2020-08-04 DIAGNOSIS — L84 Corns and callosities: Secondary | ICD-10-CM | POA: Diagnosis not present

## 2020-08-04 DIAGNOSIS — L602 Onychogryphosis: Secondary | ICD-10-CM | POA: Diagnosis not present

## 2020-08-04 DIAGNOSIS — M79672 Pain in left foot: Secondary | ICD-10-CM | POA: Diagnosis not present

## 2020-08-04 DIAGNOSIS — L723 Sebaceous cyst: Secondary | ICD-10-CM | POA: Diagnosis not present

## 2020-08-04 DIAGNOSIS — M79671 Pain in right foot: Secondary | ICD-10-CM | POA: Diagnosis not present

## 2020-08-04 DIAGNOSIS — L600 Ingrowing nail: Secondary | ICD-10-CM | POA: Diagnosis not present

## 2020-08-16 DIAGNOSIS — C44329 Squamous cell carcinoma of skin of other parts of face: Secondary | ICD-10-CM | POA: Diagnosis not present

## 2020-08-24 DIAGNOSIS — N138 Other obstructive and reflux uropathy: Secondary | ICD-10-CM | POA: Diagnosis not present

## 2020-08-24 DIAGNOSIS — N401 Enlarged prostate with lower urinary tract symptoms: Secondary | ICD-10-CM | POA: Diagnosis not present

## 2020-08-30 DIAGNOSIS — N401 Enlarged prostate with lower urinary tract symptoms: Secondary | ICD-10-CM | POA: Diagnosis not present

## 2020-08-30 DIAGNOSIS — R972 Elevated prostate specific antigen [PSA]: Secondary | ICD-10-CM | POA: Diagnosis not present

## 2020-10-05 DIAGNOSIS — R7301 Impaired fasting glucose: Secondary | ICD-10-CM | POA: Diagnosis not present

## 2020-10-05 DIAGNOSIS — E7849 Other hyperlipidemia: Secondary | ICD-10-CM | POA: Diagnosis not present

## 2020-10-05 DIAGNOSIS — M109 Gout, unspecified: Secondary | ICD-10-CM | POA: Diagnosis not present

## 2020-10-05 DIAGNOSIS — I10 Essential (primary) hypertension: Secondary | ICD-10-CM | POA: Diagnosis not present

## 2020-10-05 DIAGNOSIS — E782 Mixed hyperlipidemia: Secondary | ICD-10-CM | POA: Diagnosis not present

## 2020-10-05 DIAGNOSIS — K2100 Gastro-esophageal reflux disease with esophagitis, without bleeding: Secondary | ICD-10-CM | POA: Diagnosis not present

## 2020-10-05 DIAGNOSIS — Z1159 Encounter for screening for other viral diseases: Secondary | ICD-10-CM | POA: Diagnosis not present

## 2020-10-06 DIAGNOSIS — M79672 Pain in left foot: Secondary | ICD-10-CM | POA: Diagnosis not present

## 2020-10-06 DIAGNOSIS — L723 Sebaceous cyst: Secondary | ICD-10-CM | POA: Diagnosis not present

## 2020-10-06 DIAGNOSIS — M79671 Pain in right foot: Secondary | ICD-10-CM | POA: Diagnosis not present

## 2020-10-06 DIAGNOSIS — L600 Ingrowing nail: Secondary | ICD-10-CM | POA: Diagnosis not present

## 2020-10-06 DIAGNOSIS — L602 Onychogryphosis: Secondary | ICD-10-CM | POA: Diagnosis not present

## 2020-10-06 DIAGNOSIS — L84 Corns and callosities: Secondary | ICD-10-CM | POA: Diagnosis not present

## 2020-10-11 DIAGNOSIS — Z1331 Encounter for screening for depression: Secondary | ICD-10-CM | POA: Diagnosis not present

## 2020-10-11 DIAGNOSIS — R7301 Impaired fasting glucose: Secondary | ICD-10-CM | POA: Diagnosis not present

## 2020-10-11 DIAGNOSIS — E7849 Other hyperlipidemia: Secondary | ICD-10-CM | POA: Diagnosis not present

## 2020-10-11 DIAGNOSIS — K2100 Gastro-esophageal reflux disease with esophagitis, without bleeding: Secondary | ICD-10-CM | POA: Diagnosis not present

## 2020-10-11 DIAGNOSIS — I10 Essential (primary) hypertension: Secondary | ICD-10-CM | POA: Diagnosis not present

## 2020-10-11 DIAGNOSIS — N401 Enlarged prostate with lower urinary tract symptoms: Secondary | ICD-10-CM | POA: Diagnosis not present

## 2020-10-11 DIAGNOSIS — Z1389 Encounter for screening for other disorder: Secondary | ICD-10-CM | POA: Diagnosis not present

## 2020-11-11 DIAGNOSIS — Z87891 Personal history of nicotine dependence: Secondary | ICD-10-CM | POA: Diagnosis not present

## 2020-11-11 DIAGNOSIS — I10 Essential (primary) hypertension: Secondary | ICD-10-CM | POA: Diagnosis not present

## 2020-11-11 DIAGNOSIS — E782 Mixed hyperlipidemia: Secondary | ICD-10-CM | POA: Diagnosis not present

## 2020-11-11 DIAGNOSIS — I208 Other forms of angina pectoris: Secondary | ICD-10-CM | POA: Diagnosis not present

## 2020-11-11 DIAGNOSIS — R9431 Abnormal electrocardiogram [ECG] [EKG]: Secondary | ICD-10-CM | POA: Diagnosis not present

## 2020-11-17 DIAGNOSIS — Z23 Encounter for immunization: Secondary | ICD-10-CM | POA: Diagnosis not present

## 2020-11-22 DIAGNOSIS — K625 Hemorrhage of anus and rectum: Secondary | ICD-10-CM | POA: Diagnosis not present

## 2020-11-22 DIAGNOSIS — K5909 Other constipation: Secondary | ICD-10-CM | POA: Diagnosis not present

## 2020-12-06 DIAGNOSIS — R9431 Abnormal electrocardiogram [ECG] [EKG]: Secondary | ICD-10-CM | POA: Diagnosis not present

## 2020-12-06 DIAGNOSIS — I208 Other forms of angina pectoris: Secondary | ICD-10-CM | POA: Diagnosis not present

## 2020-12-08 DIAGNOSIS — L600 Ingrowing nail: Secondary | ICD-10-CM | POA: Diagnosis not present

## 2020-12-08 DIAGNOSIS — L84 Corns and callosities: Secondary | ICD-10-CM | POA: Diagnosis not present

## 2020-12-08 DIAGNOSIS — L602 Onychogryphosis: Secondary | ICD-10-CM | POA: Diagnosis not present

## 2020-12-08 DIAGNOSIS — M79671 Pain in right foot: Secondary | ICD-10-CM | POA: Diagnosis not present

## 2020-12-08 DIAGNOSIS — M79672 Pain in left foot: Secondary | ICD-10-CM | POA: Diagnosis not present

## 2020-12-08 DIAGNOSIS — L723 Sebaceous cyst: Secondary | ICD-10-CM | POA: Diagnosis not present

## 2020-12-13 DIAGNOSIS — D128 Benign neoplasm of rectum: Secondary | ICD-10-CM | POA: Diagnosis not present

## 2020-12-13 DIAGNOSIS — Z1211 Encounter for screening for malignant neoplasm of colon: Secondary | ICD-10-CM | POA: Diagnosis not present

## 2020-12-13 DIAGNOSIS — K621 Rectal polyp: Secondary | ICD-10-CM | POA: Diagnosis not present

## 2020-12-13 DIAGNOSIS — Z86010 Personal history of colonic polyps: Secondary | ICD-10-CM | POA: Diagnosis not present

## 2020-12-28 DIAGNOSIS — H25813 Combined forms of age-related cataract, bilateral: Secondary | ICD-10-CM | POA: Diagnosis not present

## 2021-01-11 DIAGNOSIS — D225 Melanocytic nevi of trunk: Secondary | ICD-10-CM | POA: Diagnosis not present

## 2021-01-11 DIAGNOSIS — D2261 Melanocytic nevi of right upper limb, including shoulder: Secondary | ICD-10-CM | POA: Diagnosis not present

## 2021-01-11 DIAGNOSIS — L738 Other specified follicular disorders: Secondary | ICD-10-CM | POA: Diagnosis not present

## 2021-01-11 DIAGNOSIS — Z08 Encounter for follow-up examination after completed treatment for malignant neoplasm: Secondary | ICD-10-CM | POA: Diagnosis not present

## 2021-01-11 DIAGNOSIS — L821 Other seborrheic keratosis: Secondary | ICD-10-CM | POA: Diagnosis not present

## 2021-01-11 DIAGNOSIS — Z85828 Personal history of other malignant neoplasm of skin: Secondary | ICD-10-CM | POA: Diagnosis not present

## 2021-01-11 DIAGNOSIS — C44329 Squamous cell carcinoma of skin of other parts of face: Secondary | ICD-10-CM | POA: Diagnosis not present

## 2021-01-11 DIAGNOSIS — D2262 Melanocytic nevi of left upper limb, including shoulder: Secondary | ICD-10-CM | POA: Diagnosis not present

## 2021-01-11 DIAGNOSIS — C44311 Basal cell carcinoma of skin of nose: Secondary | ICD-10-CM | POA: Diagnosis not present

## 2021-01-11 DIAGNOSIS — D2272 Melanocytic nevi of left lower limb, including hip: Secondary | ICD-10-CM | POA: Diagnosis not present

## 2021-01-18 DIAGNOSIS — Z23 Encounter for immunization: Secondary | ICD-10-CM | POA: Diagnosis not present

## 2021-02-09 DIAGNOSIS — L84 Corns and callosities: Secondary | ICD-10-CM | POA: Diagnosis not present

## 2021-02-09 DIAGNOSIS — M79672 Pain in left foot: Secondary | ICD-10-CM | POA: Diagnosis not present

## 2021-02-09 DIAGNOSIS — L600 Ingrowing nail: Secondary | ICD-10-CM | POA: Diagnosis not present

## 2021-02-09 DIAGNOSIS — M79671 Pain in right foot: Secondary | ICD-10-CM | POA: Diagnosis not present

## 2021-02-09 DIAGNOSIS — L723 Sebaceous cyst: Secondary | ICD-10-CM | POA: Diagnosis not present

## 2021-02-09 DIAGNOSIS — L602 Onychogryphosis: Secondary | ICD-10-CM | POA: Diagnosis not present

## 2021-02-13 DIAGNOSIS — Z20822 Contact with and (suspected) exposure to covid-19: Secondary | ICD-10-CM | POA: Diagnosis not present

## 2021-02-13 DIAGNOSIS — H6693 Otitis media, unspecified, bilateral: Secondary | ICD-10-CM | POA: Diagnosis not present

## 2021-02-13 DIAGNOSIS — R059 Cough, unspecified: Secondary | ICD-10-CM | POA: Diagnosis not present

## 2021-02-28 DIAGNOSIS — R972 Elevated prostate specific antigen [PSA]: Secondary | ICD-10-CM | POA: Diagnosis not present

## 2021-03-07 DIAGNOSIS — R972 Elevated prostate specific antigen [PSA]: Secondary | ICD-10-CM | POA: Diagnosis not present

## 2021-03-07 DIAGNOSIS — N401 Enlarged prostate with lower urinary tract symptoms: Secondary | ICD-10-CM | POA: Diagnosis not present

## 2021-03-09 IMAGING — DX DG CHEST 2V
2 series · 2 of 2 positions shown · non-contrast
Comparison: None

CLINICAL DATA: Preoperative evaluation for hip arthroplasty,
history hypertension

EXAM:
CHEST - 2 VIEW

[chest pa]
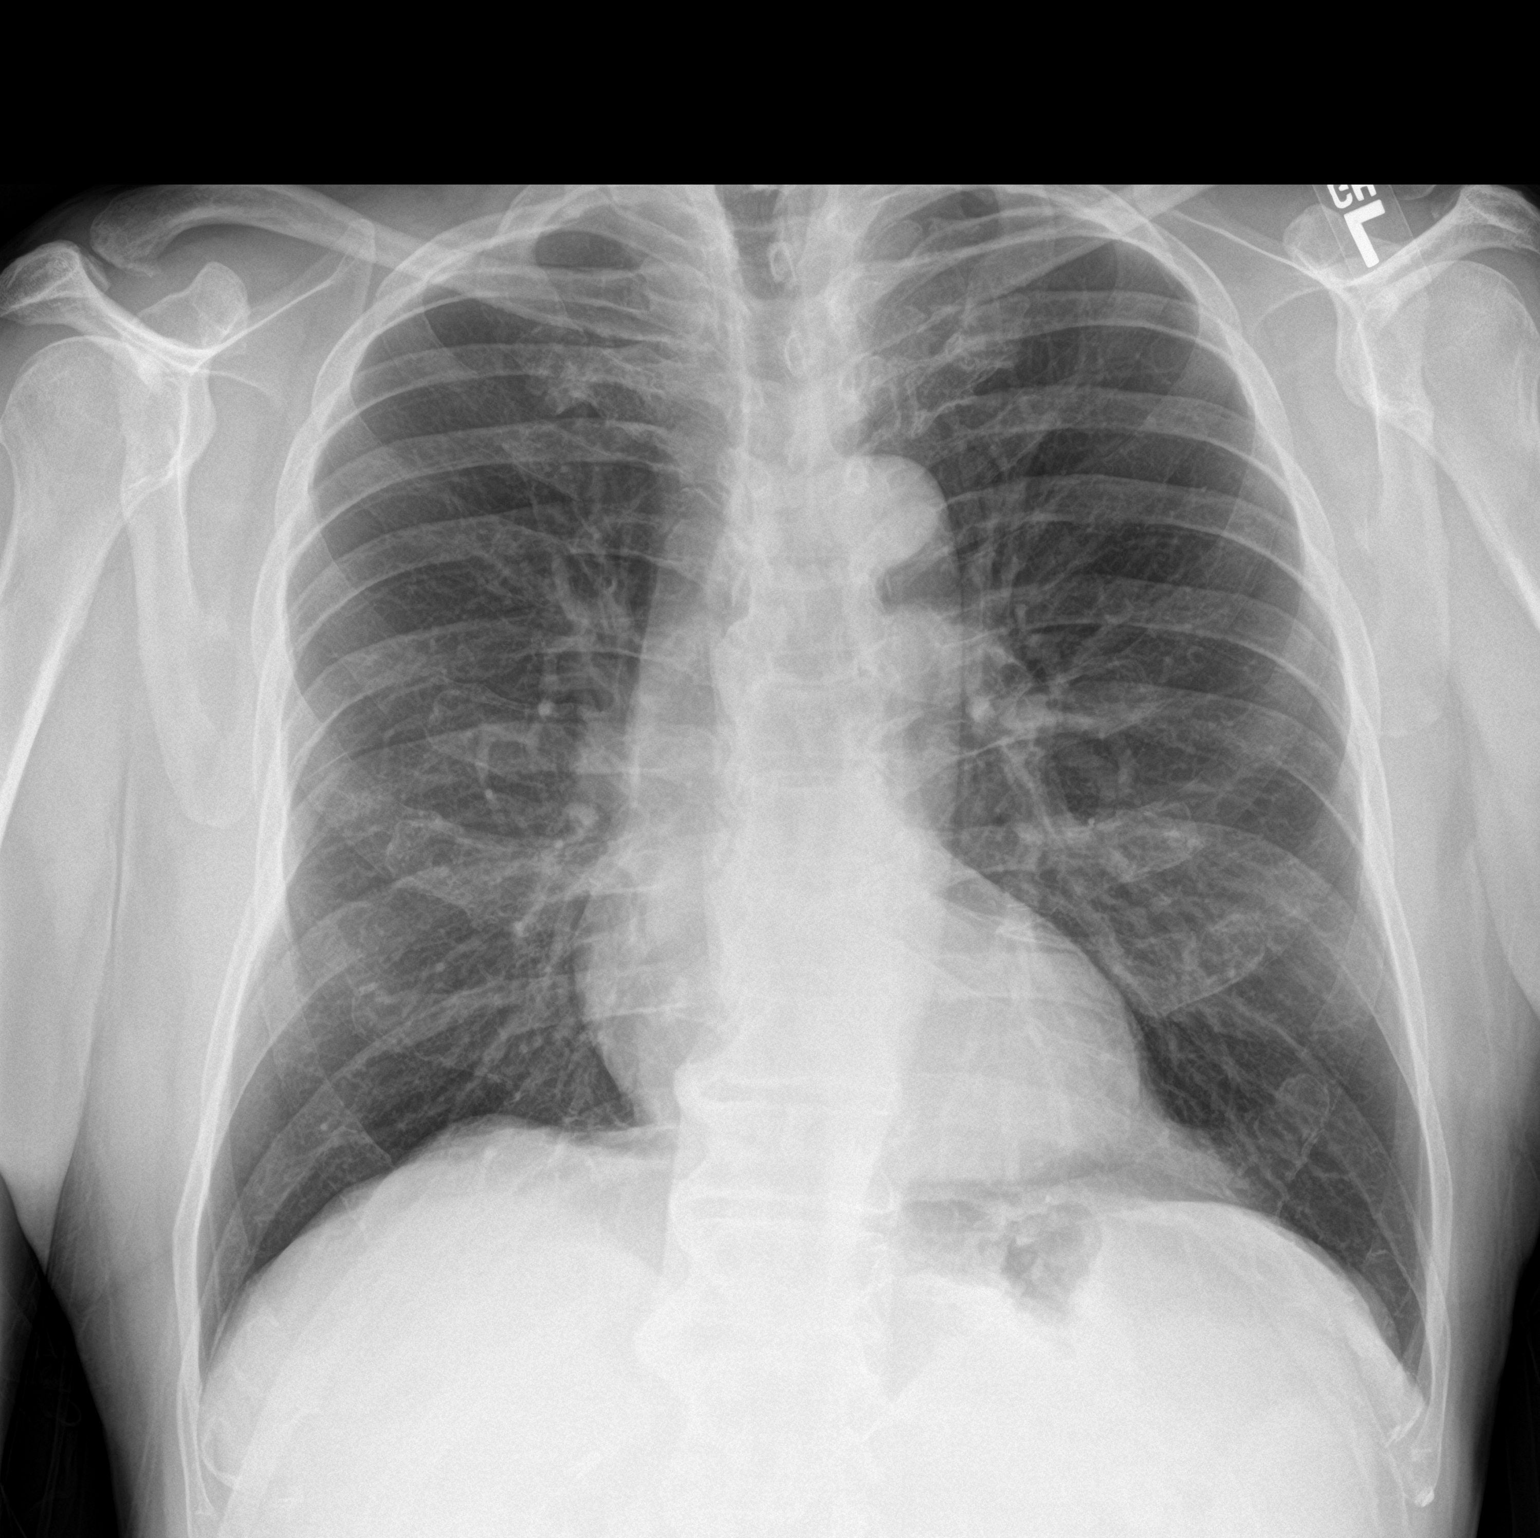

[chest lat]
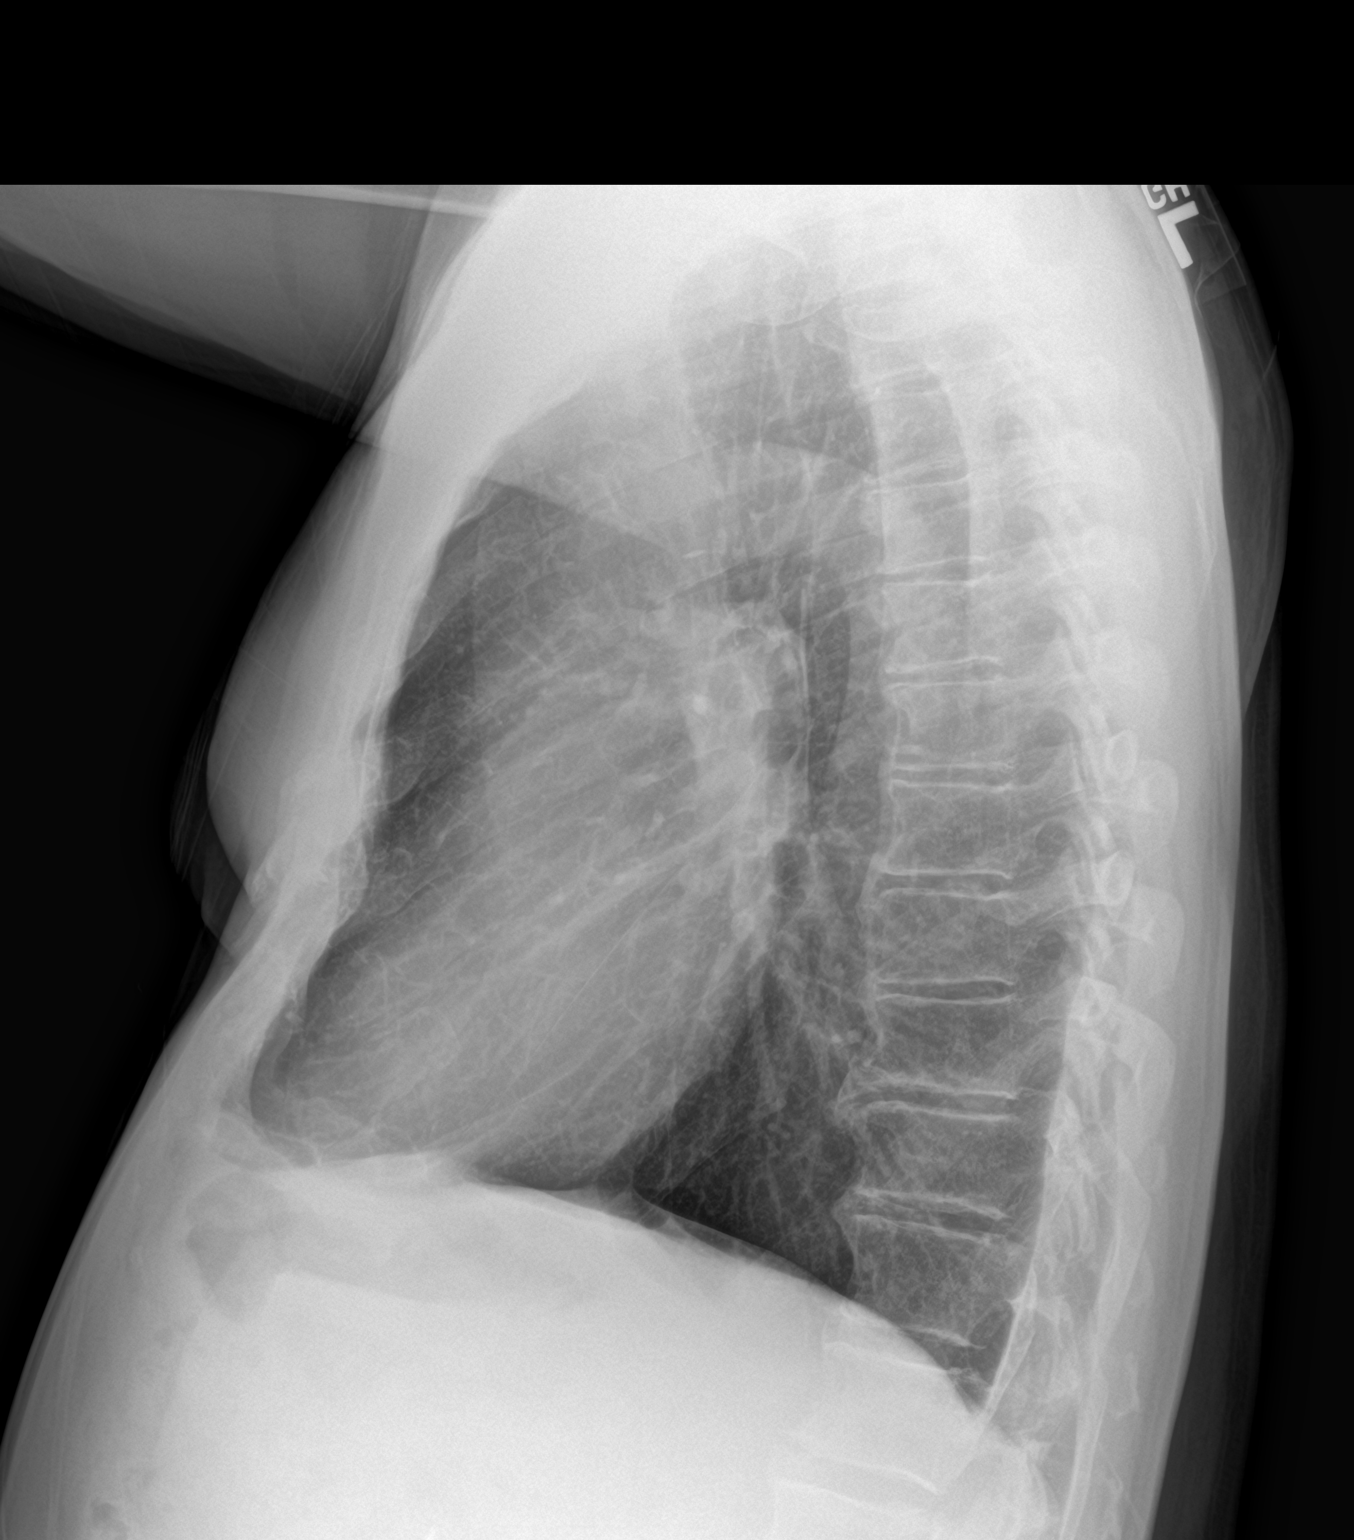

[2 of 2 positions shown; findings below may reference images not displayed]

FINDINGS: Normal heart size, mediastinal contours, and pulmonary vascularity.

Lungs clear.

No pulmonary infiltrate, pleural effusion, or pneumothorax.

Mild scattered endplate spur formation thoracic spine.
IMPRESSION: No acute abnormalities.

## 2021-03-13 IMAGING — RF DG HIP (WITH PELVIS) OPERATIVE*L*
1 series · 4 of 4 positions shown · non-contrast
Comparison: No recent.

CLINICAL DATA: Left hip replacement.

EXAM:
OPERATIVE LEFT HIP (WITH PELVIS IF PERFORMED) 2 VIEWS
TECHNIQUE: Fluoroscopic spot image(s) were submitted for interpretation
post-operatively.

[Series 1: unknown protocol · 0.20mm/px · 4 of 4 slices shown]
[im 1/4]
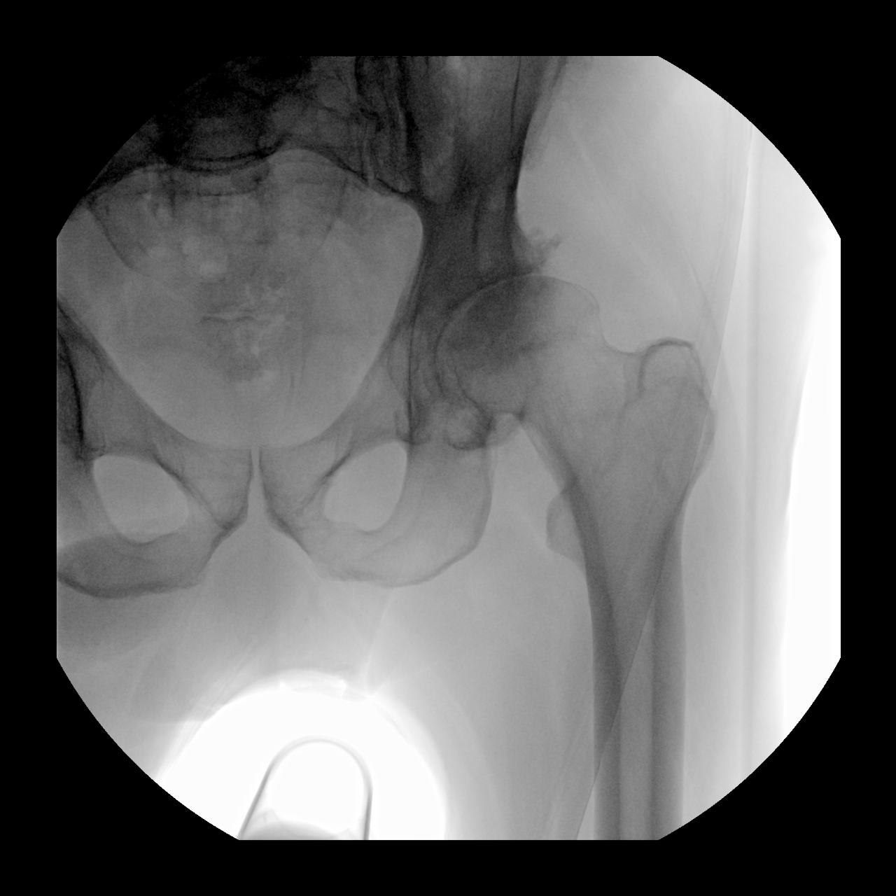
[im 2/4]
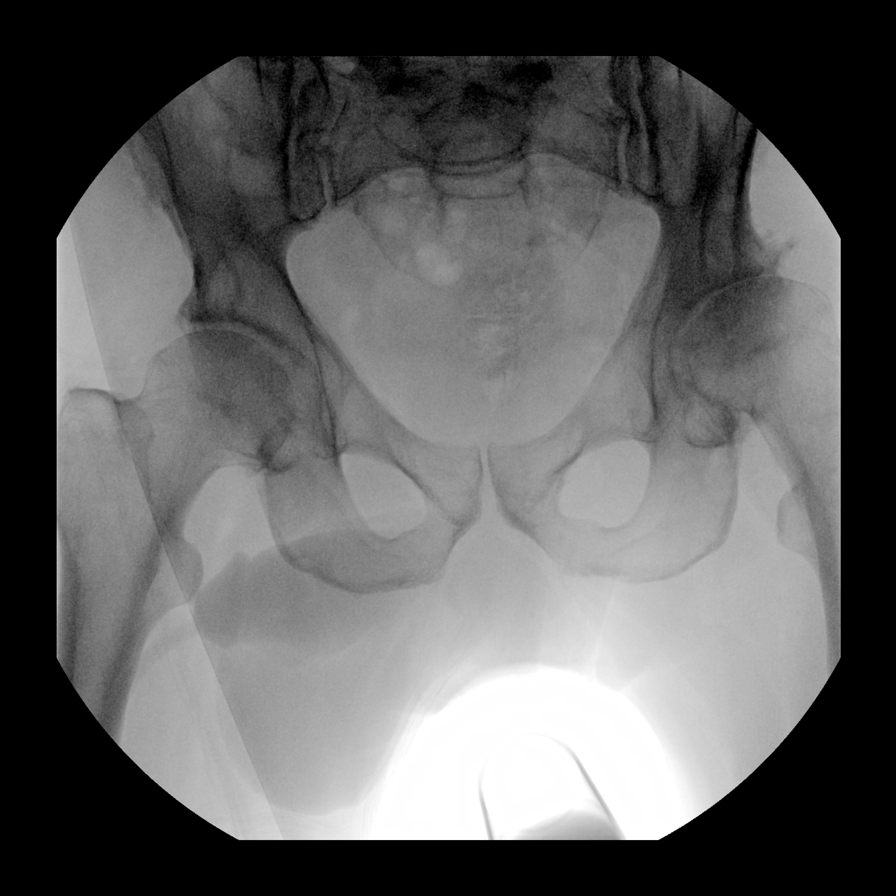
[im 3/4]
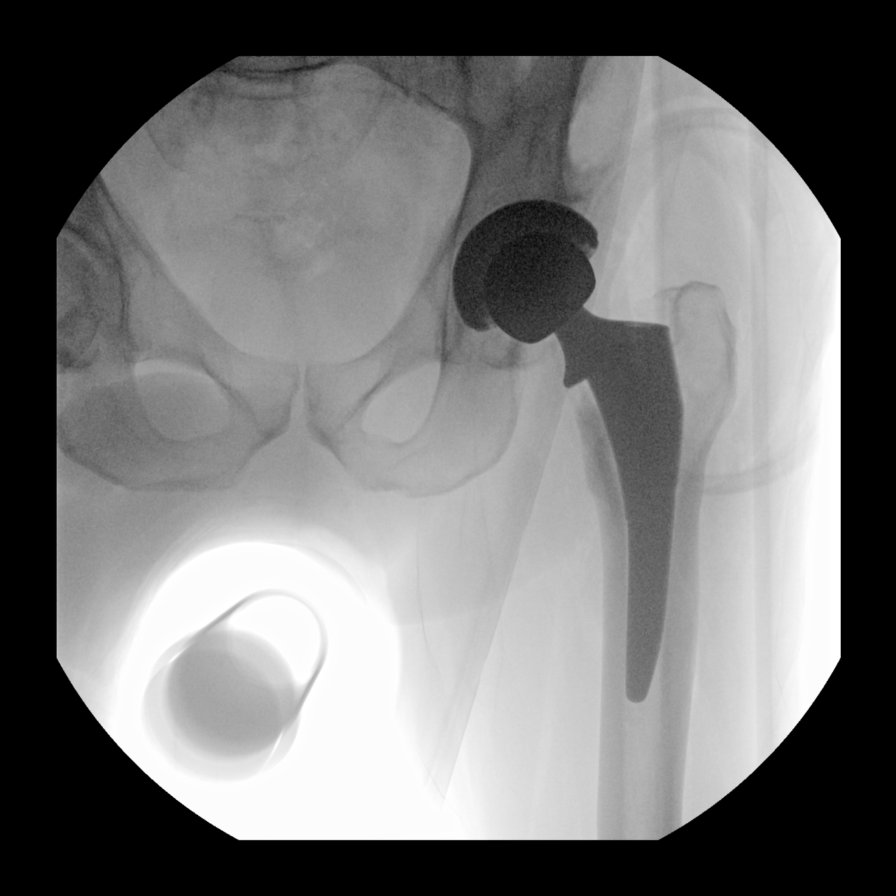
[im 4/4]
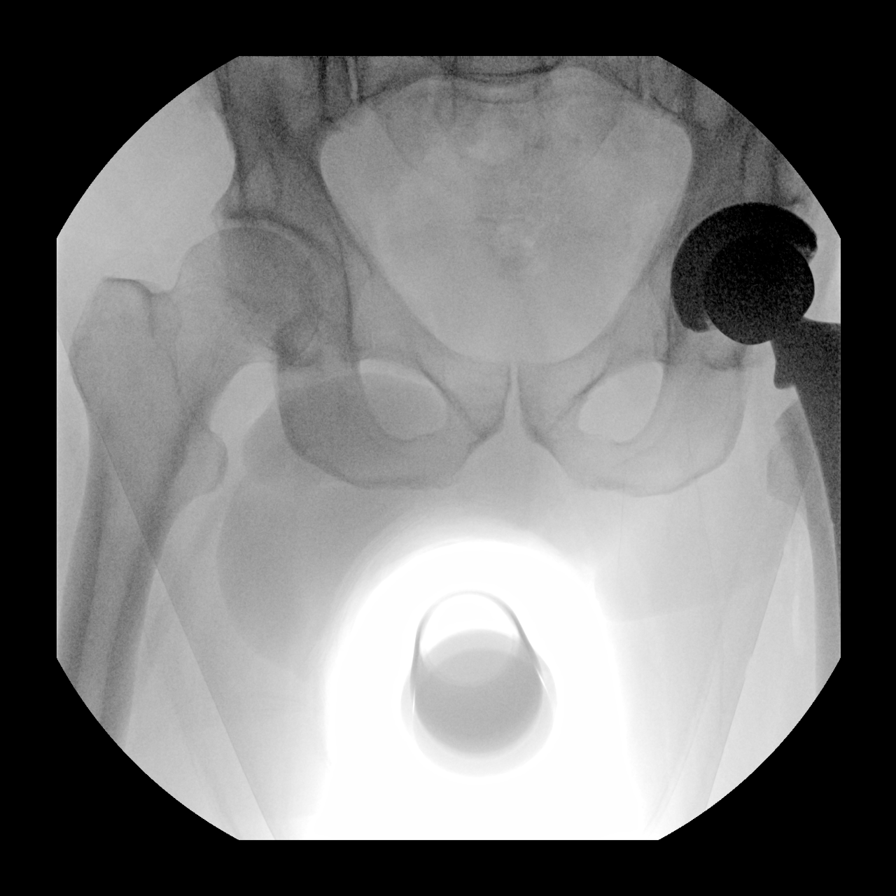

[4 of 4 positions shown; findings below may reference images not displayed]

FINDINGS: Total left hip replacement.  Hardware intact.  Anatomic alignment.
IMPRESSION: Total left hip replacement.  Anatomic alignment.

## 2021-03-29 DIAGNOSIS — J40 Bronchitis, not specified as acute or chronic: Secondary | ICD-10-CM | POA: Diagnosis not present

## 2021-03-29 DIAGNOSIS — E782 Mixed hyperlipidemia: Secondary | ICD-10-CM | POA: Diagnosis not present

## 2021-03-29 DIAGNOSIS — Z1329 Encounter for screening for other suspected endocrine disorder: Secondary | ICD-10-CM | POA: Diagnosis not present

## 2021-03-29 DIAGNOSIS — I10 Essential (primary) hypertension: Secondary | ICD-10-CM | POA: Diagnosis not present

## 2021-03-29 DIAGNOSIS — R7301 Impaired fasting glucose: Secondary | ICD-10-CM | POA: Diagnosis not present

## 2021-03-29 DIAGNOSIS — J040 Acute laryngitis: Secondary | ICD-10-CM | POA: Diagnosis not present

## 2021-03-29 DIAGNOSIS — E7849 Other hyperlipidemia: Secondary | ICD-10-CM | POA: Diagnosis not present

## 2021-03-29 DIAGNOSIS — K2100 Gastro-esophageal reflux disease with esophagitis, without bleeding: Secondary | ICD-10-CM | POA: Diagnosis not present

## 2021-03-29 DIAGNOSIS — M109 Gout, unspecified: Secondary | ICD-10-CM | POA: Diagnosis not present

## 2021-03-29 DIAGNOSIS — N183 Chronic kidney disease, stage 3 unspecified: Secondary | ICD-10-CM | POA: Diagnosis not present

## 2021-04-04 DIAGNOSIS — R7301 Impaired fasting glucose: Secondary | ICD-10-CM | POA: Diagnosis not present

## 2021-04-04 DIAGNOSIS — M1000 Idiopathic gout, unspecified site: Secondary | ICD-10-CM | POA: Diagnosis not present

## 2021-04-04 DIAGNOSIS — I10 Essential (primary) hypertension: Secondary | ICD-10-CM | POA: Diagnosis not present

## 2021-04-04 DIAGNOSIS — N401 Enlarged prostate with lower urinary tract symptoms: Secondary | ICD-10-CM | POA: Diagnosis not present

## 2021-04-04 DIAGNOSIS — Z6824 Body mass index (BMI) 24.0-24.9, adult: Secondary | ICD-10-CM | POA: Diagnosis not present

## 2021-04-04 DIAGNOSIS — E7849 Other hyperlipidemia: Secondary | ICD-10-CM | POA: Diagnosis not present

## 2021-04-04 DIAGNOSIS — F411 Generalized anxiety disorder: Secondary | ICD-10-CM | POA: Diagnosis not present

## 2021-04-07 DIAGNOSIS — N281 Cyst of kidney, acquired: Secondary | ICD-10-CM | POA: Diagnosis not present

## 2021-04-07 DIAGNOSIS — M545 Low back pain, unspecified: Secondary | ICD-10-CM | POA: Diagnosis not present

## 2021-04-07 DIAGNOSIS — R972 Elevated prostate specific antigen [PSA]: Secondary | ICD-10-CM | POA: Diagnosis not present

## 2021-04-07 DIAGNOSIS — N401 Enlarged prostate with lower urinary tract symptoms: Secondary | ICD-10-CM | POA: Diagnosis not present

## 2021-04-19 DIAGNOSIS — Z23 Encounter for immunization: Secondary | ICD-10-CM | POA: Diagnosis not present

## 2021-04-27 DIAGNOSIS — L84 Corns and callosities: Secondary | ICD-10-CM | POA: Diagnosis not present

## 2021-04-27 DIAGNOSIS — L600 Ingrowing nail: Secondary | ICD-10-CM | POA: Diagnosis not present

## 2021-04-27 DIAGNOSIS — M79672 Pain in left foot: Secondary | ICD-10-CM | POA: Diagnosis not present

## 2021-04-27 DIAGNOSIS — L602 Onychogryphosis: Secondary | ICD-10-CM | POA: Diagnosis not present

## 2021-04-27 DIAGNOSIS — M79671 Pain in right foot: Secondary | ICD-10-CM | POA: Diagnosis not present

## 2021-04-27 DIAGNOSIS — L723 Sebaceous cyst: Secondary | ICD-10-CM | POA: Diagnosis not present

## 2021-05-30 DIAGNOSIS — R972 Elevated prostate specific antigen [PSA]: Secondary | ICD-10-CM | POA: Diagnosis not present

## 2021-06-06 DIAGNOSIS — R972 Elevated prostate specific antigen [PSA]: Secondary | ICD-10-CM | POA: Diagnosis not present

## 2021-06-06 DIAGNOSIS — N401 Enlarged prostate with lower urinary tract symptoms: Secondary | ICD-10-CM | POA: Diagnosis not present

## 2021-06-06 DIAGNOSIS — M545 Low back pain, unspecified: Secondary | ICD-10-CM | POA: Diagnosis not present

## 2021-06-06 DIAGNOSIS — N281 Cyst of kidney, acquired: Secondary | ICD-10-CM | POA: Diagnosis not present

## 2021-06-14 DIAGNOSIS — I10 Essential (primary) hypertension: Secondary | ICD-10-CM | POA: Diagnosis not present

## 2021-06-14 DIAGNOSIS — I2510 Atherosclerotic heart disease of native coronary artery without angina pectoris: Secondary | ICD-10-CM | POA: Diagnosis not present

## 2021-06-14 DIAGNOSIS — E782 Mixed hyperlipidemia: Secondary | ICD-10-CM | POA: Diagnosis not present

## 2021-06-29 DIAGNOSIS — L602 Onychogryphosis: Secondary | ICD-10-CM | POA: Diagnosis not present

## 2021-06-29 DIAGNOSIS — M79672 Pain in left foot: Secondary | ICD-10-CM | POA: Diagnosis not present

## 2021-06-29 DIAGNOSIS — L600 Ingrowing nail: Secondary | ICD-10-CM | POA: Diagnosis not present

## 2021-06-29 DIAGNOSIS — L723 Sebaceous cyst: Secondary | ICD-10-CM | POA: Diagnosis not present

## 2021-06-29 DIAGNOSIS — M79671 Pain in right foot: Secondary | ICD-10-CM | POA: Diagnosis not present

## 2021-06-29 DIAGNOSIS — L84 Corns and callosities: Secondary | ICD-10-CM | POA: Diagnosis not present

## 2021-07-05 DIAGNOSIS — L821 Other seborrheic keratosis: Secondary | ICD-10-CM | POA: Diagnosis not present

## 2021-07-05 DIAGNOSIS — D1801 Hemangioma of skin and subcutaneous tissue: Secondary | ICD-10-CM | POA: Diagnosis not present

## 2021-07-06 DIAGNOSIS — M1000 Idiopathic gout, unspecified site: Secondary | ICD-10-CM | POA: Diagnosis not present

## 2021-07-06 DIAGNOSIS — M1612 Unilateral primary osteoarthritis, left hip: Secondary | ICD-10-CM | POA: Diagnosis not present

## 2021-07-06 DIAGNOSIS — I10 Essential (primary) hypertension: Secondary | ICD-10-CM | POA: Diagnosis not present

## 2021-07-06 DIAGNOSIS — E7849 Other hyperlipidemia: Secondary | ICD-10-CM | POA: Diagnosis not present

## 2021-07-06 DIAGNOSIS — E782 Mixed hyperlipidemia: Secondary | ICD-10-CM | POA: Diagnosis not present

## 2021-07-12 DIAGNOSIS — I10 Essential (primary) hypertension: Secondary | ICD-10-CM | POA: Diagnosis not present

## 2021-07-12 DIAGNOSIS — I25118 Atherosclerotic heart disease of native coronary artery with other forms of angina pectoris: Secondary | ICD-10-CM | POA: Diagnosis not present

## 2021-07-12 DIAGNOSIS — E782 Mixed hyperlipidemia: Secondary | ICD-10-CM | POA: Diagnosis not present

## 2021-08-23 DIAGNOSIS — S40862A Insect bite (nonvenomous) of left upper arm, initial encounter: Secondary | ICD-10-CM | POA: Diagnosis not present

## 2021-08-31 DIAGNOSIS — L723 Sebaceous cyst: Secondary | ICD-10-CM | POA: Diagnosis not present

## 2021-08-31 DIAGNOSIS — L600 Ingrowing nail: Secondary | ICD-10-CM | POA: Diagnosis not present

## 2021-08-31 DIAGNOSIS — M79671 Pain in right foot: Secondary | ICD-10-CM | POA: Diagnosis not present

## 2021-08-31 DIAGNOSIS — M79672 Pain in left foot: Secondary | ICD-10-CM | POA: Diagnosis not present

## 2021-08-31 DIAGNOSIS — L602 Onychogryphosis: Secondary | ICD-10-CM | POA: Diagnosis not present

## 2021-08-31 DIAGNOSIS — L84 Corns and callosities: Secondary | ICD-10-CM | POA: Diagnosis not present

## 2021-09-27 DIAGNOSIS — I25118 Atherosclerotic heart disease of native coronary artery with other forms of angina pectoris: Secondary | ICD-10-CM | POA: Diagnosis not present

## 2021-10-04 DIAGNOSIS — I10 Essential (primary) hypertension: Secondary | ICD-10-CM | POA: Diagnosis not present

## 2021-10-04 DIAGNOSIS — E782 Mixed hyperlipidemia: Secondary | ICD-10-CM | POA: Diagnosis not present

## 2021-10-04 DIAGNOSIS — I25118 Atherosclerotic heart disease of native coronary artery with other forms of angina pectoris: Secondary | ICD-10-CM | POA: Diagnosis not present

## 2021-10-10 DIAGNOSIS — N183 Chronic kidney disease, stage 3 unspecified: Secondary | ICD-10-CM | POA: Diagnosis not present

## 2021-10-10 DIAGNOSIS — K2100 Gastro-esophageal reflux disease with esophagitis, without bleeding: Secondary | ICD-10-CM | POA: Diagnosis not present

## 2021-10-10 DIAGNOSIS — E782 Mixed hyperlipidemia: Secondary | ICD-10-CM | POA: Diagnosis not present

## 2021-10-10 DIAGNOSIS — R7301 Impaired fasting glucose: Secondary | ICD-10-CM | POA: Diagnosis not present

## 2021-10-10 DIAGNOSIS — M109 Gout, unspecified: Secondary | ICD-10-CM | POA: Diagnosis not present

## 2021-10-10 DIAGNOSIS — E7849 Other hyperlipidemia: Secondary | ICD-10-CM | POA: Diagnosis not present

## 2021-10-17 DIAGNOSIS — I10 Essential (primary) hypertension: Secondary | ICD-10-CM | POA: Diagnosis not present

## 2021-10-17 DIAGNOSIS — N401 Enlarged prostate with lower urinary tract symptoms: Secondary | ICD-10-CM | POA: Diagnosis not present

## 2021-10-17 DIAGNOSIS — Z6823 Body mass index (BMI) 23.0-23.9, adult: Secondary | ICD-10-CM | POA: Diagnosis not present

## 2021-10-17 DIAGNOSIS — M1000 Idiopathic gout, unspecified site: Secondary | ICD-10-CM | POA: Diagnosis not present

## 2021-10-17 DIAGNOSIS — E7849 Other hyperlipidemia: Secondary | ICD-10-CM | POA: Diagnosis not present

## 2021-10-17 DIAGNOSIS — F411 Generalized anxiety disorder: Secondary | ICD-10-CM | POA: Diagnosis not present

## 2021-10-17 DIAGNOSIS — E875 Hyperkalemia: Secondary | ICD-10-CM | POA: Diagnosis not present

## 2021-10-17 DIAGNOSIS — R7301 Impaired fasting glucose: Secondary | ICD-10-CM | POA: Diagnosis not present

## 2021-11-02 DIAGNOSIS — L600 Ingrowing nail: Secondary | ICD-10-CM | POA: Diagnosis not present

## 2021-11-02 DIAGNOSIS — M79671 Pain in right foot: Secondary | ICD-10-CM | POA: Diagnosis not present

## 2021-11-02 DIAGNOSIS — L602 Onychogryphosis: Secondary | ICD-10-CM | POA: Diagnosis not present

## 2021-11-02 DIAGNOSIS — M79672 Pain in left foot: Secondary | ICD-10-CM | POA: Diagnosis not present

## 2021-11-02 DIAGNOSIS — L84 Corns and callosities: Secondary | ICD-10-CM | POA: Diagnosis not present

## 2021-11-02 DIAGNOSIS — L723 Sebaceous cyst: Secondary | ICD-10-CM | POA: Diagnosis not present

## 2021-12-12 DIAGNOSIS — R972 Elevated prostate specific antigen [PSA]: Secondary | ICD-10-CM | POA: Diagnosis not present

## 2021-12-19 DIAGNOSIS — N401 Enlarged prostate with lower urinary tract symptoms: Secondary | ICD-10-CM | POA: Diagnosis not present

## 2021-12-19 DIAGNOSIS — R972 Elevated prostate specific antigen [PSA]: Secondary | ICD-10-CM | POA: Diagnosis not present

## 2021-12-19 DIAGNOSIS — M545 Low back pain, unspecified: Secondary | ICD-10-CM | POA: Diagnosis not present

## 2021-12-19 DIAGNOSIS — N281 Cyst of kidney, acquired: Secondary | ICD-10-CM | POA: Diagnosis not present

## 2022-01-02 DIAGNOSIS — Z85828 Personal history of other malignant neoplasm of skin: Secondary | ICD-10-CM | POA: Diagnosis not present

## 2022-01-02 DIAGNOSIS — D2372 Other benign neoplasm of skin of left lower limb, including hip: Secondary | ICD-10-CM | POA: Diagnosis not present

## 2022-01-02 DIAGNOSIS — Z08 Encounter for follow-up examination after completed treatment for malignant neoplasm: Secondary | ICD-10-CM | POA: Diagnosis not present

## 2022-01-02 DIAGNOSIS — L218 Other seborrheic dermatitis: Secondary | ICD-10-CM | POA: Diagnosis not present

## 2022-01-02 DIAGNOSIS — L814 Other melanin hyperpigmentation: Secondary | ICD-10-CM | POA: Diagnosis not present

## 2022-01-02 DIAGNOSIS — L821 Other seborrheic keratosis: Secondary | ICD-10-CM | POA: Diagnosis not present

## 2022-01-02 DIAGNOSIS — L570 Actinic keratosis: Secondary | ICD-10-CM | POA: Diagnosis not present

## 2022-01-02 DIAGNOSIS — L738 Other specified follicular disorders: Secondary | ICD-10-CM | POA: Diagnosis not present

## 2022-01-02 DIAGNOSIS — Z86007 Personal history of in-situ neoplasm of skin: Secondary | ICD-10-CM | POA: Diagnosis not present

## 2022-01-09 DIAGNOSIS — I10 Essential (primary) hypertension: Secondary | ICD-10-CM | POA: Diagnosis not present

## 2022-01-09 DIAGNOSIS — E782 Mixed hyperlipidemia: Secondary | ICD-10-CM | POA: Diagnosis not present

## 2022-01-09 DIAGNOSIS — I2510 Atherosclerotic heart disease of native coronary artery without angina pectoris: Secondary | ICD-10-CM | POA: Diagnosis not present

## 2022-01-17 DIAGNOSIS — H43811 Vitreous degeneration, right eye: Secondary | ICD-10-CM | POA: Diagnosis not present

## 2022-01-17 DIAGNOSIS — H25813 Combined forms of age-related cataract, bilateral: Secondary | ICD-10-CM | POA: Diagnosis not present

## 2022-01-18 DIAGNOSIS — L84 Corns and callosities: Secondary | ICD-10-CM | POA: Diagnosis not present

## 2022-01-18 DIAGNOSIS — L723 Sebaceous cyst: Secondary | ICD-10-CM | POA: Diagnosis not present

## 2022-01-18 DIAGNOSIS — M79672 Pain in left foot: Secondary | ICD-10-CM | POA: Diagnosis not present

## 2022-01-18 DIAGNOSIS — M79671 Pain in right foot: Secondary | ICD-10-CM | POA: Diagnosis not present

## 2022-01-18 DIAGNOSIS — L600 Ingrowing nail: Secondary | ICD-10-CM | POA: Diagnosis not present

## 2022-01-18 DIAGNOSIS — L602 Onychogryphosis: Secondary | ICD-10-CM | POA: Diagnosis not present

## 2022-02-03 DIAGNOSIS — Z23 Encounter for immunization: Secondary | ICD-10-CM | POA: Diagnosis not present

## 2022-02-24 DIAGNOSIS — Z23 Encounter for immunization: Secondary | ICD-10-CM | POA: Diagnosis not present

## 2022-03-08 DIAGNOSIS — M79672 Pain in left foot: Secondary | ICD-10-CM | POA: Diagnosis not present

## 2022-03-08 DIAGNOSIS — L723 Sebaceous cyst: Secondary | ICD-10-CM | POA: Diagnosis not present

## 2022-03-08 DIAGNOSIS — L602 Onychogryphosis: Secondary | ICD-10-CM | POA: Diagnosis not present

## 2022-03-08 DIAGNOSIS — L84 Corns and callosities: Secondary | ICD-10-CM | POA: Diagnosis not present

## 2022-03-08 DIAGNOSIS — L600 Ingrowing nail: Secondary | ICD-10-CM | POA: Diagnosis not present

## 2022-03-08 DIAGNOSIS — M79671 Pain in right foot: Secondary | ICD-10-CM | POA: Diagnosis not present

## 2022-03-28 DIAGNOSIS — I2510 Atherosclerotic heart disease of native coronary artery without angina pectoris: Secondary | ICD-10-CM | POA: Diagnosis not present

## 2022-03-28 DIAGNOSIS — E782 Mixed hyperlipidemia: Secondary | ICD-10-CM | POA: Diagnosis not present

## 2022-03-28 DIAGNOSIS — I10 Essential (primary) hypertension: Secondary | ICD-10-CM | POA: Diagnosis not present

## 2022-04-26 DIAGNOSIS — M1000 Idiopathic gout, unspecified site: Secondary | ICD-10-CM | POA: Diagnosis not present

## 2022-04-26 DIAGNOSIS — E7849 Other hyperlipidemia: Secondary | ICD-10-CM | POA: Diagnosis not present

## 2022-04-26 DIAGNOSIS — E782 Mixed hyperlipidemia: Secondary | ICD-10-CM | POA: Diagnosis not present

## 2022-04-26 DIAGNOSIS — R7301 Impaired fasting glucose: Secondary | ICD-10-CM | POA: Diagnosis not present

## 2022-05-01 DIAGNOSIS — R600 Localized edema: Secondary | ICD-10-CM | POA: Diagnosis not present

## 2022-05-01 DIAGNOSIS — M79669 Pain in unspecified lower leg: Secondary | ICD-10-CM | POA: Diagnosis not present

## 2022-05-01 DIAGNOSIS — I8392 Asymptomatic varicose veins of left lower extremity: Secondary | ICD-10-CM | POA: Diagnosis not present

## 2022-05-01 DIAGNOSIS — R972 Elevated prostate specific antigen [PSA]: Secondary | ICD-10-CM | POA: Diagnosis not present

## 2022-05-01 DIAGNOSIS — I10 Essential (primary) hypertension: Secondary | ICD-10-CM | POA: Diagnosis not present

## 2022-05-02 DIAGNOSIS — M79605 Pain in left leg: Secondary | ICD-10-CM | POA: Diagnosis not present

## 2022-05-03 DIAGNOSIS — E7849 Other hyperlipidemia: Secondary | ICD-10-CM | POA: Diagnosis not present

## 2022-05-03 DIAGNOSIS — I10 Essential (primary) hypertension: Secondary | ICD-10-CM | POA: Diagnosis not present

## 2022-05-03 DIAGNOSIS — Z1389 Encounter for screening for other disorder: Secondary | ICD-10-CM | POA: Diagnosis not present

## 2022-05-03 DIAGNOSIS — N1831 Chronic kidney disease, stage 3a: Secondary | ICD-10-CM | POA: Diagnosis not present

## 2022-05-03 DIAGNOSIS — Z0001 Encounter for general adult medical examination with abnormal findings: Secondary | ICD-10-CM | POA: Diagnosis not present

## 2022-05-03 DIAGNOSIS — M1000 Idiopathic gout, unspecified site: Secondary | ICD-10-CM | POA: Diagnosis not present

## 2022-05-03 DIAGNOSIS — N401 Enlarged prostate with lower urinary tract symptoms: Secondary | ICD-10-CM | POA: Diagnosis not present

## 2022-05-03 DIAGNOSIS — E875 Hyperkalemia: Secondary | ICD-10-CM | POA: Diagnosis not present

## 2022-05-03 DIAGNOSIS — M1612 Unilateral primary osteoarthritis, left hip: Secondary | ICD-10-CM | POA: Diagnosis not present

## 2022-05-03 DIAGNOSIS — F411 Generalized anxiety disorder: Secondary | ICD-10-CM | POA: Diagnosis not present

## 2022-05-03 DIAGNOSIS — Z1331 Encounter for screening for depression: Secondary | ICD-10-CM | POA: Diagnosis not present

## 2022-05-03 DIAGNOSIS — R7301 Impaired fasting glucose: Secondary | ICD-10-CM | POA: Diagnosis not present

## 2022-05-08 DIAGNOSIS — N281 Cyst of kidney, acquired: Secondary | ICD-10-CM | POA: Diagnosis not present

## 2022-05-08 DIAGNOSIS — R972 Elevated prostate specific antigen [PSA]: Secondary | ICD-10-CM | POA: Diagnosis not present

## 2022-05-08 DIAGNOSIS — N401 Enlarged prostate with lower urinary tract symptoms: Secondary | ICD-10-CM | POA: Diagnosis not present

## 2022-05-17 DIAGNOSIS — B353 Tinea pedis: Secondary | ICD-10-CM | POA: Diagnosis not present

## 2022-05-17 DIAGNOSIS — M79672 Pain in left foot: Secondary | ICD-10-CM | POA: Diagnosis not present

## 2022-05-17 DIAGNOSIS — L600 Ingrowing nail: Secondary | ICD-10-CM | POA: Diagnosis not present

## 2022-05-17 DIAGNOSIS — L602 Onychogryphosis: Secondary | ICD-10-CM | POA: Diagnosis not present

## 2022-05-17 DIAGNOSIS — M79671 Pain in right foot: Secondary | ICD-10-CM | POA: Diagnosis not present

## 2022-05-17 DIAGNOSIS — L723 Sebaceous cyst: Secondary | ICD-10-CM | POA: Diagnosis not present

## 2022-06-05 DIAGNOSIS — M1611 Unilateral primary osteoarthritis, right hip: Secondary | ICD-10-CM | POA: Diagnosis not present

## 2022-06-20 DIAGNOSIS — L298 Other pruritus: Secondary | ICD-10-CM | POA: Diagnosis not present

## 2022-06-20 DIAGNOSIS — R238 Other skin changes: Secondary | ICD-10-CM | POA: Diagnosis not present

## 2022-06-20 DIAGNOSIS — L820 Inflamed seborrheic keratosis: Secondary | ICD-10-CM | POA: Diagnosis not present

## 2022-06-20 DIAGNOSIS — B078 Other viral warts: Secondary | ICD-10-CM | POA: Diagnosis not present

## 2022-06-20 DIAGNOSIS — L0889 Other specified local infections of the skin and subcutaneous tissue: Secondary | ICD-10-CM | POA: Diagnosis not present

## 2022-06-20 DIAGNOSIS — L538 Other specified erythematous conditions: Secondary | ICD-10-CM | POA: Diagnosis not present

## 2022-07-19 DIAGNOSIS — L723 Sebaceous cyst: Secondary | ICD-10-CM | POA: Diagnosis not present

## 2022-07-19 DIAGNOSIS — M79672 Pain in left foot: Secondary | ICD-10-CM | POA: Diagnosis not present

## 2022-07-19 DIAGNOSIS — L602 Onychogryphosis: Secondary | ICD-10-CM | POA: Diagnosis not present

## 2022-07-19 DIAGNOSIS — B353 Tinea pedis: Secondary | ICD-10-CM | POA: Diagnosis not present

## 2022-07-19 DIAGNOSIS — M79671 Pain in right foot: Secondary | ICD-10-CM | POA: Diagnosis not present

## 2022-07-19 DIAGNOSIS — L600 Ingrowing nail: Secondary | ICD-10-CM | POA: Diagnosis not present

## 2022-07-31 DIAGNOSIS — M1000 Idiopathic gout, unspecified site: Secondary | ICD-10-CM | POA: Diagnosis not present

## 2022-07-31 DIAGNOSIS — E7849 Other hyperlipidemia: Secondary | ICD-10-CM | POA: Diagnosis not present

## 2022-07-31 DIAGNOSIS — E782 Mixed hyperlipidemia: Secondary | ICD-10-CM | POA: Diagnosis not present

## 2022-07-31 DIAGNOSIS — E875 Hyperkalemia: Secondary | ICD-10-CM | POA: Diagnosis not present

## 2022-08-07 DIAGNOSIS — N401 Enlarged prostate with lower urinary tract symptoms: Secondary | ICD-10-CM | POA: Diagnosis not present

## 2022-08-07 DIAGNOSIS — F411 Generalized anxiety disorder: Secondary | ICD-10-CM | POA: Diagnosis not present

## 2022-08-07 DIAGNOSIS — E875 Hyperkalemia: Secondary | ICD-10-CM | POA: Diagnosis not present

## 2022-08-07 DIAGNOSIS — Z6823 Body mass index (BMI) 23.0-23.9, adult: Secondary | ICD-10-CM | POA: Diagnosis not present

## 2022-08-07 DIAGNOSIS — K2100 Gastro-esophageal reflux disease with esophagitis, without bleeding: Secondary | ICD-10-CM | POA: Diagnosis not present

## 2022-08-07 DIAGNOSIS — N1831 Chronic kidney disease, stage 3a: Secondary | ICD-10-CM | POA: Diagnosis not present

## 2022-08-07 DIAGNOSIS — M1612 Unilateral primary osteoarthritis, left hip: Secondary | ICD-10-CM | POA: Diagnosis not present

## 2022-08-07 DIAGNOSIS — E7849 Other hyperlipidemia: Secondary | ICD-10-CM | POA: Diagnosis not present

## 2022-08-07 DIAGNOSIS — I10 Essential (primary) hypertension: Secondary | ICD-10-CM | POA: Diagnosis not present

## 2022-08-07 DIAGNOSIS — R7301 Impaired fasting glucose: Secondary | ICD-10-CM | POA: Diagnosis not present

## 2022-08-07 DIAGNOSIS — M1000 Idiopathic gout, unspecified site: Secondary | ICD-10-CM | POA: Diagnosis not present

## 2022-08-14 DIAGNOSIS — R972 Elevated prostate specific antigen [PSA]: Secondary | ICD-10-CM | POA: Diagnosis not present

## 2022-08-22 DIAGNOSIS — R972 Elevated prostate specific antigen [PSA]: Secondary | ICD-10-CM | POA: Diagnosis not present

## 2022-08-22 DIAGNOSIS — N281 Cyst of kidney, acquired: Secondary | ICD-10-CM | POA: Diagnosis not present

## 2022-08-22 DIAGNOSIS — N401 Enlarged prostate with lower urinary tract symptoms: Secondary | ICD-10-CM | POA: Diagnosis not present

## 2022-09-01 DIAGNOSIS — Z23 Encounter for immunization: Secondary | ICD-10-CM | POA: Diagnosis not present

## 2022-09-29 DIAGNOSIS — J309 Allergic rhinitis, unspecified: Secondary | ICD-10-CM | POA: Diagnosis not present

## 2022-09-29 DIAGNOSIS — Z20828 Contact with and (suspected) exposure to other viral communicable diseases: Secondary | ICD-10-CM | POA: Diagnosis not present

## 2022-09-29 DIAGNOSIS — R059 Cough, unspecified: Secondary | ICD-10-CM | POA: Diagnosis not present

## 2022-09-29 DIAGNOSIS — R030 Elevated blood-pressure reading, without diagnosis of hypertension: Secondary | ICD-10-CM | POA: Diagnosis not present

## 2022-10-10 DIAGNOSIS — E785 Hyperlipidemia, unspecified: Secondary | ICD-10-CM | POA: Diagnosis not present

## 2022-10-10 DIAGNOSIS — I10 Essential (primary) hypertension: Secondary | ICD-10-CM | POA: Diagnosis not present

## 2022-10-10 DIAGNOSIS — I2510 Atherosclerotic heart disease of native coronary artery without angina pectoris: Secondary | ICD-10-CM | POA: Diagnosis not present

## 2022-10-18 DIAGNOSIS — M79671 Pain in right foot: Secondary | ICD-10-CM | POA: Diagnosis not present

## 2022-10-18 DIAGNOSIS — L602 Onychogryphosis: Secondary | ICD-10-CM | POA: Diagnosis not present

## 2022-10-18 DIAGNOSIS — L600 Ingrowing nail: Secondary | ICD-10-CM | POA: Diagnosis not present

## 2022-10-18 DIAGNOSIS — L723 Sebaceous cyst: Secondary | ICD-10-CM | POA: Diagnosis not present

## 2022-10-18 DIAGNOSIS — M79672 Pain in left foot: Secondary | ICD-10-CM | POA: Diagnosis not present

## 2022-10-18 DIAGNOSIS — L84 Corns and callosities: Secondary | ICD-10-CM | POA: Diagnosis not present

## 2022-11-01 DIAGNOSIS — N400 Enlarged prostate without lower urinary tract symptoms: Secondary | ICD-10-CM | POA: Diagnosis not present

## 2022-11-01 DIAGNOSIS — R972 Elevated prostate specific antigen [PSA]: Secondary | ICD-10-CM | POA: Diagnosis not present

## 2022-11-01 DIAGNOSIS — N402 Nodular prostate without lower urinary tract symptoms: Secondary | ICD-10-CM | POA: Diagnosis not present

## 2022-11-13 DIAGNOSIS — N401 Enlarged prostate with lower urinary tract symptoms: Secondary | ICD-10-CM | POA: Diagnosis not present

## 2022-11-13 DIAGNOSIS — R972 Elevated prostate specific antigen [PSA]: Secondary | ICD-10-CM | POA: Diagnosis not present

## 2022-11-27 DIAGNOSIS — E875 Hyperkalemia: Secondary | ICD-10-CM | POA: Diagnosis not present

## 2022-11-27 DIAGNOSIS — E782 Mixed hyperlipidemia: Secondary | ICD-10-CM | POA: Diagnosis not present

## 2022-11-27 DIAGNOSIS — M109 Gout, unspecified: Secondary | ICD-10-CM | POA: Diagnosis not present

## 2022-11-27 DIAGNOSIS — N183 Chronic kidney disease, stage 3 unspecified: Secondary | ICD-10-CM | POA: Diagnosis not present

## 2022-11-27 DIAGNOSIS — R7301 Impaired fasting glucose: Secondary | ICD-10-CM | POA: Diagnosis not present

## 2022-11-27 DIAGNOSIS — K2100 Gastro-esophageal reflux disease with esophagitis, without bleeding: Secondary | ICD-10-CM | POA: Diagnosis not present

## 2022-11-27 DIAGNOSIS — E7849 Other hyperlipidemia: Secondary | ICD-10-CM | POA: Diagnosis not present

## 2022-12-04 DIAGNOSIS — E7849 Other hyperlipidemia: Secondary | ICD-10-CM | POA: Diagnosis not present

## 2022-12-04 DIAGNOSIS — M1000 Idiopathic gout, unspecified site: Secondary | ICD-10-CM | POA: Diagnosis not present

## 2022-12-04 DIAGNOSIS — E875 Hyperkalemia: Secondary | ICD-10-CM | POA: Diagnosis not present

## 2022-12-04 DIAGNOSIS — M1612 Unilateral primary osteoarthritis, left hip: Secondary | ICD-10-CM | POA: Diagnosis not present

## 2022-12-04 DIAGNOSIS — R7301 Impaired fasting glucose: Secondary | ICD-10-CM | POA: Diagnosis not present

## 2022-12-04 DIAGNOSIS — K2100 Gastro-esophageal reflux disease with esophagitis, without bleeding: Secondary | ICD-10-CM | POA: Diagnosis not present

## 2022-12-04 DIAGNOSIS — I10 Essential (primary) hypertension: Secondary | ICD-10-CM | POA: Diagnosis not present

## 2022-12-04 DIAGNOSIS — N401 Enlarged prostate with lower urinary tract symptoms: Secondary | ICD-10-CM | POA: Diagnosis not present

## 2022-12-04 DIAGNOSIS — N1831 Chronic kidney disease, stage 3a: Secondary | ICD-10-CM | POA: Diagnosis not present

## 2022-12-04 DIAGNOSIS — Z6823 Body mass index (BMI) 23.0-23.9, adult: Secondary | ICD-10-CM | POA: Diagnosis not present

## 2022-12-04 DIAGNOSIS — F411 Generalized anxiety disorder: Secondary | ICD-10-CM | POA: Diagnosis not present

## 2022-12-26 DIAGNOSIS — H903 Sensorineural hearing loss, bilateral: Secondary | ICD-10-CM | POA: Diagnosis not present

## 2022-12-26 DIAGNOSIS — J342 Deviated nasal septum: Secondary | ICD-10-CM | POA: Diagnosis not present

## 2023-01-08 DIAGNOSIS — Z86007 Personal history of in-situ neoplasm of skin: Secondary | ICD-10-CM | POA: Diagnosis not present

## 2023-01-08 DIAGNOSIS — L814 Other melanin hyperpigmentation: Secondary | ICD-10-CM | POA: Diagnosis not present

## 2023-01-08 DIAGNOSIS — L821 Other seborrheic keratosis: Secondary | ICD-10-CM | POA: Diagnosis not present

## 2023-01-08 DIAGNOSIS — D2372 Other benign neoplasm of skin of left lower limb, including hip: Secondary | ICD-10-CM | POA: Diagnosis not present

## 2023-01-08 DIAGNOSIS — Z08 Encounter for follow-up examination after completed treatment for malignant neoplasm: Secondary | ICD-10-CM | POA: Diagnosis not present

## 2023-01-08 DIAGNOSIS — D692 Other nonthrombocytopenic purpura: Secondary | ICD-10-CM | POA: Diagnosis not present

## 2023-01-08 DIAGNOSIS — Z85828 Personal history of other malignant neoplasm of skin: Secondary | ICD-10-CM | POA: Diagnosis not present

## 2023-01-08 DIAGNOSIS — L570 Actinic keratosis: Secondary | ICD-10-CM | POA: Diagnosis not present

## 2023-01-17 DIAGNOSIS — L723 Sebaceous cyst: Secondary | ICD-10-CM | POA: Diagnosis not present

## 2023-01-17 DIAGNOSIS — M79671 Pain in right foot: Secondary | ICD-10-CM | POA: Diagnosis not present

## 2023-01-17 DIAGNOSIS — L602 Onychogryphosis: Secondary | ICD-10-CM | POA: Diagnosis not present

## 2023-01-17 DIAGNOSIS — L600 Ingrowing nail: Secondary | ICD-10-CM | POA: Diagnosis not present

## 2023-01-17 DIAGNOSIS — M79672 Pain in left foot: Secondary | ICD-10-CM | POA: Diagnosis not present

## 2023-01-17 DIAGNOSIS — L84 Corns and callosities: Secondary | ICD-10-CM | POA: Diagnosis not present

## 2023-01-22 DIAGNOSIS — Z23 Encounter for immunization: Secondary | ICD-10-CM | POA: Diagnosis not present

## 2023-02-12 DIAGNOSIS — R972 Elevated prostate specific antigen [PSA]: Secondary | ICD-10-CM | POA: Diagnosis not present

## 2023-02-19 DIAGNOSIS — Z23 Encounter for immunization: Secondary | ICD-10-CM | POA: Diagnosis not present

## 2023-02-20 DIAGNOSIS — R972 Elevated prostate specific antigen [PSA]: Secondary | ICD-10-CM | POA: Diagnosis not present

## 2023-02-20 DIAGNOSIS — N401 Enlarged prostate with lower urinary tract symptoms: Secondary | ICD-10-CM | POA: Diagnosis not present

## 2023-02-26 DIAGNOSIS — M1611 Unilateral primary osteoarthritis, right hip: Secondary | ICD-10-CM | POA: Diagnosis not present

## 2023-03-07 DIAGNOSIS — M1611 Unilateral primary osteoarthritis, right hip: Secondary | ICD-10-CM | POA: Diagnosis not present

## 2023-03-07 DIAGNOSIS — E7849 Other hyperlipidemia: Secondary | ICD-10-CM | POA: Diagnosis not present

## 2023-03-07 DIAGNOSIS — N183 Chronic kidney disease, stage 3 unspecified: Secondary | ICD-10-CM | POA: Diagnosis not present

## 2023-03-07 DIAGNOSIS — I10 Essential (primary) hypertension: Secondary | ICD-10-CM | POA: Diagnosis not present

## 2023-03-07 DIAGNOSIS — Z6822 Body mass index (BMI) 22.0-22.9, adult: Secondary | ICD-10-CM | POA: Diagnosis not present

## 2023-03-12 DIAGNOSIS — N401 Enlarged prostate with lower urinary tract symptoms: Secondary | ICD-10-CM | POA: Diagnosis not present

## 2023-03-12 DIAGNOSIS — R972 Elevated prostate specific antigen [PSA]: Secondary | ICD-10-CM | POA: Diagnosis not present

## 2023-03-12 DIAGNOSIS — N390 Urinary tract infection, site not specified: Secondary | ICD-10-CM | POA: Diagnosis not present

## 2023-04-04 DIAGNOSIS — M79672 Pain in left foot: Secondary | ICD-10-CM | POA: Diagnosis not present

## 2023-04-04 DIAGNOSIS — L723 Sebaceous cyst: Secondary | ICD-10-CM | POA: Diagnosis not present

## 2023-04-04 DIAGNOSIS — L84 Corns and callosities: Secondary | ICD-10-CM | POA: Diagnosis not present

## 2023-04-04 DIAGNOSIS — M79671 Pain in right foot: Secondary | ICD-10-CM | POA: Diagnosis not present

## 2023-04-04 DIAGNOSIS — L602 Onychogryphosis: Secondary | ICD-10-CM | POA: Diagnosis not present

## 2023-04-04 DIAGNOSIS — L600 Ingrowing nail: Secondary | ICD-10-CM | POA: Diagnosis not present

## 2023-04-09 DIAGNOSIS — M1611 Unilateral primary osteoarthritis, right hip: Secondary | ICD-10-CM | POA: Diagnosis not present

## 2023-04-09 DIAGNOSIS — R262 Difficulty in walking, not elsewhere classified: Secondary | ICD-10-CM | POA: Diagnosis not present

## 2023-04-09 DIAGNOSIS — M25651 Stiffness of right hip, not elsewhere classified: Secondary | ICD-10-CM | POA: Diagnosis not present

## 2023-04-10 DIAGNOSIS — I25118 Atherosclerotic heart disease of native coronary artery with other forms of angina pectoris: Secondary | ICD-10-CM | POA: Diagnosis not present

## 2023-04-10 DIAGNOSIS — I10 Essential (primary) hypertension: Secondary | ICD-10-CM | POA: Diagnosis not present

## 2023-04-10 DIAGNOSIS — Z01810 Encounter for preprocedural cardiovascular examination: Secondary | ICD-10-CM | POA: Diagnosis not present

## 2023-04-10 DIAGNOSIS — E782 Mixed hyperlipidemia: Secondary | ICD-10-CM | POA: Diagnosis not present

## 2023-04-19 ENCOUNTER — Other Ambulatory Visit: Payer: Self-pay | Admitting: Orthopaedic Surgery

## 2023-04-23 DIAGNOSIS — H25813 Combined forms of age-related cataract, bilateral: Secondary | ICD-10-CM | POA: Diagnosis not present

## 2023-04-23 DIAGNOSIS — H43811 Vitreous degeneration, right eye: Secondary | ICD-10-CM | POA: Diagnosis not present

## 2023-04-23 DIAGNOSIS — H524 Presbyopia: Secondary | ICD-10-CM | POA: Diagnosis not present

## 2023-04-26 NOTE — Progress Notes (Addendum)
 COVID Vaccine Completed: yes  Date of COVID positive in last 90 days: no  PCP - Atilano Mt, MD- requested most recent office notes- in media tab Cardiologist - Ozell Hays, MD LOV 04/10/23  Cardiac clearance by Ozell Hays, MD 04/10/23 in Epic   Chest x-ray - n/a EKG - 04/30/23 Epic/chart Stress Test - 09/27/21 [facility] ECHO - 09/27/21 [facility] Cardiac Cath - in 70s per pt Pacemaker/ICD device last checked: n/a Spinal Cord Stimulator:n/a  Bowel Prep - no  Sleep Study - n/a CPAP -   Fasting Blood Sugar - n/a Checks Blood Sugar _____ times a day  Last dose of GLP1 agonist-  N/A GLP1 instructions:  Hold 7 days before surgery    Last dose of SGLT-2 inhibitors-  N/A SGLT-2 instructions:  Hold 3 days before surgery    Blood Thinner Instructions: n/a Aspirin  Instructions: Last Dose:  Activity level: Can go up a flight of stairs and perform activities of daily living without stopping and without symptoms of chest pain or shortness of breath.   Anesthesia review: CAD, HTN  Patient denies shortness of breath, fever, cough and chest pain at PAT appointment  Patient verbalized understanding of instructions that were given to them at the PAT appointment. Patient was also instructed that they will need to review over the PAT instructions again at home before surgery.

## 2023-04-26 NOTE — Patient Instructions (Addendum)
 SURGICAL WAITING ROOM VISITATION  Patients having surgery or a procedure may have no more than 2 support people in the waiting area - these visitors may rotate.    Children under the age of 64 must have an adult with them who is not the patient.  Due to an increase in RSV and influenza rates and associated hospitalizations, children ages 79 and under may not visit patients in [facility] hospitals.  If the patient needs to stay at the hospital during part of their recovery, the visitor guidelines for inpatient rooms apply. Pre-op nurse will coordinate an appropriate time for 1 support person to accompany patient in pre-op.  This support person may not rotate.    Please refer to the [facility] website for the visitor guidelines for Inpatients (after your surgery is over and you are in a regular room).    Your procedure is scheduled on: 05/07/22   Report to [facility] Main Entrance    Report to admitting at 9:50 AM   Call this number if you have problems the morning of surgery [phone]   Do not eat food :After Midnight.   After Midnight you may have the following liquids until 9:20 AM DAY OF SURGERY  Water  Non-Citrus Juices (without pulp, NO RED-Apple, White grape, White cranberry) Black Coffee (NO MILK/CREAM OR CREAMERS, sugar ok)  Clear Tea (NO MILK/CREAM OR CREAMERS, sugar ok) regular and decaf                             Plain Jell-O (NO RED)                                           Fruit ices (not with fruit pulp, NO RED)                                     Popsicles (NO RED)                                                               Sports drinks like Gatorade (NO RED)                 The day of surgery:  Drink ONE (1) Pre-Surgery Clear Ensure at 9:20 AM the morning of surgery. Drink in one sitting. Do not sip.  This drink was given to you during your hospital  pre-op appointment visit. Nothing else to drink after completing the  Pre-Surgery Clear  Ensure.          If you have questions, please contact your surgeon's office.   FOLLOW BOWEL PREP AND ANY ADDITIONAL PRE OP INSTRUCTIONS YOU RECEIVED FROM YOUR SURGEON'S OFFICE!!!     Oral Hygiene is also important to reduce your risk of infection.                                    Remember - BRUSH YOUR TEETH THE MORNING OF SURGERY WITH YOUR REGULAR TOOTHPASTE  DENTURES WILL  BE REMOVED PRIOR TO SURGERY PLEASE DO NOT APPLY Poly grip OR ADHESIVES!!!   Stop all vitamins and herbal supplements 7 days before surgery.   Take these medicines the morning of surgery with A SIP OF WATER : Atorvastatin, Carvedilol, Famotidine, Tamsulosin               You may not have any metal on your body including jewelry, and body piercing             Do not wear lotions, powders, cologne, or deodorant              Men may shave face and neck.   Do not bring valuables to the hospital. [facility] IS NOT             RESPONSIBLE   FOR VALUABLES.   Contacts, glasses, dentures or bridgework may not be worn into surgery.   Bring small overnight bag day of surgery.   DO NOT BRING YOUR HOME MEDICATIONS TO THE HOSPITAL. PHARMACY WILL DISPENSE MEDICATIONS LISTED ON YOUR MEDICATION LIST TO YOU DURING YOUR ADMISSION IN THE HOSPITAL!              Please read over the following fact sheets you were given: IF YOU HAVE QUESTIONS ABOUT YOUR PRE-OP INSTRUCTIONS PLEASE CALL [phone]    If you received a COVID test during your pre-op visit  it is requested that you wear a mask when out in public, stay away from anyone that may not be feeling well and notify your surgeon if you develop symptoms. If you test positive for Covid or have been in contact with anyone that has tested positive in the last 10 days please notify you surgeon.      Pre-operative 5 CHG Bath Instructions   You can play a key role in reducing the risk of infection after surgery. Your skin needs to be as free of germs as possible. You  can reduce the number of germs on your skin by washing with CHG (chlorhexidine  gluconate) soap before surgery. CHG is an antiseptic soap that kills germs and continues to kill germs even after washing.   DO NOT use if you have an allergy to chlorhexidine /CHG or antibacterial soaps. If your skin becomes reddened or irritated, stop using the CHG and notify one of our RNs at [phone].   Please shower with the CHG soap starting 4 days before surgery using the following schedule:     Please keep in mind the following:  DO NOT shave, including legs and underarms, starting the day of your first shower.   You may shave your face at any point before/day of surgery.  Place clean sheets on your bed the day you start using CHG soap. Use a clean washcloth (not used since being washed) for each shower. DO NOT sleep with pets once you start using the CHG.   CHG Shower Instructions:  If you choose to wash your hair and private area, wash first with your normal shampoo/soap.  After you use shampoo/soap, rinse your hair and body thoroughly to remove shampoo/soap residue.  Turn the water  OFF and apply about 3 tablespoons (45 ml) of CHG soap to a CLEAN washcloth.  Apply CHG soap ONLY FROM YOUR NECK DOWN TO YOUR TOES (washing for 3-5 minutes)  DO NOT use CHG soap on face, private areas, open wounds, or sores.  Pay special attention to the area where your surgery is being performed.  If you are having back surgery, having  someone wash your back for you may be helpful. Wait 2 minutes after CHG soap is applied, then you may rinse off the CHG soap.  Pat dry with a clean towel  Put on clean clothes/pajamas   If you choose to wear lotion, please use ONLY the CHG-compatible lotions on the back of this paper.     Additional instructions for the day of surgery: DO NOT APPLY any lotions, deodorants, cologne, or perfumes.   Put on clean/comfortable clothes.  Brush your teeth.  Ask your nurse before applying any  prescription medications to the skin.      CHG Compatible Lotions   Aveeno Moisturizing lotion  Cetaphil Moisturizing Cream  Cetaphil Moisturizing Lotion  Clairol Herbal Essence Moisturizing Lotion, Dry Skin  Clairol Herbal Essence Moisturizing Lotion, Extra Dry Skin  Clairol Herbal Essence Moisturizing Lotion, Normal Skin  Curel Age Defying Therapeutic Moisturizing Lotion with Alpha Hydroxy  Curel Extreme Care Body Lotion  Curel Soothing Hands Moisturizing Hand Lotion  Curel Therapeutic Moisturizing Cream, Fragrance-Free  Curel Therapeutic Moisturizing Lotion, Fragrance-Free  Curel Therapeutic Moisturizing Lotion, Original Formula  Eucerin Daily Replenishing Lotion  Eucerin Dry Skin Therapy Plus Alpha Hydroxy Crme  Eucerin Dry Skin Therapy Plus Alpha Hydroxy Lotion  Eucerin Original Crme  Eucerin Original Lotion  Eucerin Plus Crme Eucerin Plus Lotion  Eucerin TriLipid Replenishing Lotion  Keri Anti-Bacterial Hand Lotion  Keri Deep Conditioning Original Lotion Dry Skin Formula Softly Scented  Keri Deep Conditioning Original Lotion, Fragrance Free Sensitive Skin Formula  Keri Lotion Fast Absorbing Fragrance Free Sensitive Skin Formula  Keri Lotion Fast Absorbing Softly Scented Dry Skin Formula  Keri Original Lotion  Keri Skin Renewal Lotion Keri Silky Smooth Lotion  Keri Silky Smooth Sensitive Skin Lotion  Nivea Body Creamy Conditioning Oil  Nivea Body Extra Enriched Lotion  Nivea Body Original Lotion  Nivea Body Sheer Moisturizing Lotion Nivea Crme  Nivea Skin Firming Lotion  NutraDerm 30 Skin Lotion  NutraDerm Skin Lotion  NutraDerm Therapeutic Skin Cream  NutraDerm Therapeutic Skin Lotion  ProShield Protective Hand Cream  Provon moisturizing lotion   Incentive Spirometer  An incentive spirometer is a tool that can help keep your lungs clear and active. This tool measures how well you are filling your lungs with each breath. Taking long deep breaths may help  reverse or decrease the chance of developing breathing (pulmonary) problems (especially infection) following: A long period of time when you are unable to move or be active. BEFORE THE PROCEDURE  If the spirometer includes an indicator to show your best effort, your nurse or respiratory therapist will set it to a desired goal. If possible, sit up straight or lean slightly forward. Try not to slouch. Hold the incentive spirometer in an upright position. INSTRUCTIONS FOR USE  Sit on the edge of your bed if possible, or sit up as far as you can in bed or on a chair. Hold the incentive spirometer in an upright position. Breathe out normally. Place the mouthpiece in your mouth and seal your lips tightly around it. Breathe in slowly and as deeply as possible, raising the piston or the ball toward the top of the column. Hold your breath for 3-5 seconds or for as long as possible. Allow the piston or ball to fall to the bottom of the column. Remove the mouthpiece from your mouth and breathe out normally. Rest for a few seconds and repeat Steps 1 through 7 at least 10 times every 1-2 hours when you are awake. Take  your time and take a few normal breaths between deep breaths. The spirometer may include an indicator to show your best effort. Use the indicator as a goal to work toward during each repetition. After each set of 10 deep breaths, practice coughing to be sure your lungs are clear. If you have an incision (the cut made at the time of surgery), support your incision when coughing by placing a pillow or rolled up towels firmly against it. Once you are able to get out of bed, walk around indoors and cough well. You may stop using the incentive spirometer when instructed by your caregiver.  RISKS AND COMPLICATIONS Take your time so you do not get dizzy or light-headed. If you are in pain, you may need to take or ask for pain medication before doing incentive spirometry. It is harder to take a deep  breath if you are having pain. AFTER USE Rest and breathe slowly and easily. It can be helpful to keep track of a log of your progress. Your caregiver can provide you with a simple table to help with this. If you are using the spirometer at home, follow these instructions: SEEK MEDICAL CARE IF:  You are having difficultly using the spirometer. You have trouble using the spirometer as often as instructed. Your pain medication is not giving enough relief while using the spirometer. You develop fever of 100.5 F (38.1 C) or higher. SEEK IMMEDIATE MEDICAL CARE IF:  You cough up bloody sputum that had not been present before. You develop fever of 102 F (38.9 C) or greater. You develop worsening pain at or near the incision site. MAKE SURE YOU:  Understand these instructions. Will watch your condition. Will get help right away if you are not doing well or get worse. Document Released: 08/21/2006 Document Revised: 07/03/2011 Document Reviewed: 10/22/2006 Exit[employer] Patient Information 2014 [employer], [place].   ________________________________________________________________________ WHAT IS A BLOOD TRANSFUSION? Blood Transfusion Information  A transfusion is the replacement of blood or some of its parts. Blood is made up of multiple cells which provide different functions. Red blood cells carry oxygen and are used for blood loss replacement. White blood cells fight against infection. Platelets control bleeding. Plasma helps clot blood. Other blood products are available for specialized needs, such as hemophilia or other clotting disorders. BEFORE THE TRANSFUSION  Who gives blood for transfusions?  Healthy volunteers who are fully evaluated to make sure their blood is safe. This is blood bank blood. Transfusion therapy is the safest it has ever been in the practice of medicine. Before blood is taken from a donor, a complete history is taken to make sure that person has no history of diseases  nor engages in risky social behavior (examples are intravenous drug use or sexual activity with multiple partners). The donor's travel history is screened to minimize risk of transmitting infections, such as malaria. The donated blood is tested for signs of infectious diseases, such as HIV and hepatitis. The blood is then tested to be sure it is compatible with you in order to minimize the chance of a transfusion reaction. If you or a relative donates blood, this is often done in anticipation of surgery and is not appropriate for emergency situations. It takes many days to process the donated blood. RISKS AND COMPLICATIONS Although transfusion therapy is very safe and saves many lives, the main dangers of transfusion include:  Getting an infectious disease. Developing a transfusion reaction. This is an allergic reaction to something in the blood you  were given. Every precaution is taken to prevent this. The decision to have a blood transfusion has been considered carefully by your caregiver before blood is given. Blood is not given unless the benefits outweigh the risks. AFTER THE TRANSFUSION Right after receiving a blood transfusion, you will usually feel much better and more energetic. This is especially true if your red blood cells have gotten low (anemic). The transfusion raises the level of the red blood cells which carry oxygen, and this usually causes an energy increase. The nurse administering the transfusion will monitor you carefully for complications. HOME CARE INSTRUCTIONS  No special instructions are needed after a transfusion. You may find your energy is better. Speak with your caregiver about any limitations on activity for underlying diseases you may have. SEEK MEDICAL CARE IF:  Your condition is not improving after your transfusion. You develop redness or irritation at the intravenous (IV) site. SEEK IMMEDIATE MEDICAL CARE IF:  Any of the following symptoms occur over the next 12  hours: Shaking chills. You have a temperature by mouth above 102 F (38.9 C), not controlled by medicine. Chest, back, or muscle pain. People around you feel you are not acting correctly or are confused. Shortness of breath or difficulty breathing. Dizziness and fainting. You get a rash or develop hives. You have a decrease in urine output. Your urine turns a dark color or changes to pink, red, or brown. Any of the following symptoms occur over the next 10 days: You have a temperature by mouth above 102 F (38.9 C), not controlled by medicine. Shortness of breath. Weakness after normal activity. The white part of the eye turns yellow (jaundice). You have a decrease in the amount of urine or are urinating less often. Your urine turns a dark color or changes to pink, red, or brown. Document Released: 04/07/2000 Document Revised: 07/03/2011 Document Reviewed: 11/25/2007 [facility] Patient Information 2014 [employer], [place].  _______________________________________________________________________

## 2023-04-30 ENCOUNTER — Other Ambulatory Visit: Payer: Self-pay

## 2023-04-30 ENCOUNTER — Encounter (HOSPITAL_COMMUNITY)
Admission: RE | Admit: 2023-04-30 | Discharge: 2023-04-30 | Disposition: A | Discharge status: Home / Self Care | Payer: Medicare Other | Source: Ambulatory Visit | Attending: Orthopaedic Surgery | Admitting: Orthopaedic Surgery

## 2023-04-30 ENCOUNTER — Encounter (HOSPITAL_COMMUNITY): Payer: Self-pay

## 2023-04-30 VITALS — BP 154/92 | HR 70 | Temp 98.0°F | Resp 16 | Ht 69.0 in | Wt 154.0 lb

## 2023-04-30 DIAGNOSIS — Z01818 Encounter for other preprocedural examination: Secondary | ICD-10-CM | POA: Insufficient documentation

## 2023-04-30 DIAGNOSIS — I10 Essential (primary) hypertension: Secondary | ICD-10-CM | POA: Insufficient documentation

## 2023-04-30 HISTORY — DX: Atherosclerotic heart disease of native coronary artery without angina pectoris: I25.10

## 2023-04-30 HISTORY — DX: Gastro-esophageal reflux disease without esophagitis: K21.9

## 2023-04-30 HISTORY — DX: Personal history of urinary calculi: Z87.442

## 2023-04-30 LAB — BASIC METABOLIC PANEL
Anion gap: 7 (ref 5–15)
BUN: 27 mg/dL — ABNORMAL HIGH (ref 8–23)
CO2: 25 mmol/L (ref 22–32)
Calcium: 9.8 mg/dL (ref 8.9–10.3)
Chloride: 107 mmol/L (ref 98–111)
Creatinine, Ser: 1.34 mg/dL — ABNORMAL HIGH (ref 0.61–1.24)
GFR, Estimated: 57 mL/min — ABNORMAL LOW (ref >60)
Glucose, Bld: 109 mg/dL — ABNORMAL HIGH (ref 70–99)
Potassium: 4.4 mmol/L (ref 3.5–5.1)
Sodium: 139 mmol/L (ref 135–145)

## 2023-04-30 LAB — CBC
HCT: 38.1 % — ABNORMAL LOW (ref 39.0–52.0)
Hemoglobin: 13.1 g/dL (ref 13.0–17.0)
MCH: 30.9 pg (ref 26.0–34.0)
MCHC: 34.4 g/dL (ref 30.0–36.0)
MCV: 89.9 fL (ref 80.0–100.0)
Platelets: 173 10*3/uL (ref 150–400)
RBC: 4.24 MIL/uL (ref 4.22–5.81)
RDW: 13.6 % (ref 11.5–15.5)
WBC: 5.9 10*3/uL (ref 4.0–10.5)
nRBC: 0 % (ref 0.0–0.2)

## 2023-04-30 LAB — SURGICAL PCR SCREEN
MRSA, PCR: NEGATIVE
Staphylococcus aureus: NEGATIVE

## 2023-05-03 NOTE — Progress Notes (Signed)
 Anesthesia Chart Review   Case: [id number] Date/Time: 05/08/23 1202   Procedure: RIGHT TOTAL HIP ARTHROPLASTY ANTERIOR APPROACH (Right: Hip)   Anesthesia type: Spinal   Pre-op diagnosis: right hip degenerative joint disease   Location: [facility] ROOM 06 / [facility]   Surgeons: Sheril Coy, MD       DISCUSSION:72 y.o. never smoker with h/o HTN, CAD, CKD Stage III, right hip djd scheduled for above procedure 05/08/2023 with Dr. Coy Sheril.   Pt seen by cardiology 04/10/2023 for preoperative evaluation.  Per OV note, No symptoms, reasonable fitness no need for additional testing. Known distal LAD not amenable to stent. Med rx.  VS: BP (!) 154/92   Pulse 70   Temp 36.7 C (Oral)   Resp 16   Ht 5' 9 (1.753 m)   Wt 69.9 kg   SpO2 100%   BMI 22.74 kg/m   PROVIDERS: Sasser, Deward ORN, MD is PCP    LABS: Labs reviewed: Acceptable for surgery. (all labs ordered are listed, but only abnormal results are displayed)  Labs Reviewed  BASIC METABOLIC PANEL - Abnormal; Notable for the following components:      Result Value   Glucose, Bld 109 (*)    BUN 27 (*)    Creatinine, Ser 1.34 (*)    GFR, Estimated 57 (*)    All other components within normal limits  CBC - Abnormal; Notable for the following components:   HCT 38.1 (*)    All other components within normal limits  SURGICAL PCR SCREEN  TYPE AND SCREEN     IMAGES:   EKG:   CV:  Past Medical History:  Diagnosis Date   Anxiety    Arthritis    Chronic kidney disease (CKD), stage III (moderate) (HCC)    Coronary artery disease    GERD (gastroesophageal reflux disease)    History of kidney stones    Hypertension    Impaired fasting glucose     Past Surgical History:  Procedure Laterality Date   CARDIAC CATHETERIZATION     CHOLECYSTECTOMY     HERNIA REPAIR     TOTAL HIP ARTHROPLASTY Left 06/15/2020   Procedure: LEFT TOTAL HIP ARTHROPLASTY ANTERIOR APPROACH;  Surgeon: Sheril Coy, MD;  Location: [facility];  Service:  Orthopedics;  Laterality: Left;    MEDICATIONS:  acetaminophen  (TYLENOL ) 325 MG tablet   acidophilus (RISAQUAD) CAPS capsule   atorvastatin (LIPITOR) 40 MG tablet   benazepril (LOTENSIN) 5 MG tablet   carboxymethylcellulose (REFRESH PLUS) 0.5 % SOLN   carvedilol (COREG) 25 MG tablet   famotidine (PEPCID) 20 MG tablet   Flaxseed, Linseed, (FLAXSEED OIL) 1200 MG CAPS   Magnesium 250 MG TABS   Multiple Vitamins-Minerals (MULTIVITAMIN WITH MINERALS) tablet   probenecid (BENEMID) 500 MG tablet   tamsulosin  (FLOMAX ) 0.4 MG CAPS capsule   TURMERIC PO   vitamin B-12 (CYANOCOBALAMIN) 1000 MCG tablet   No current facility-administered medications for this encounter.    Harlene Hoots Ward, PA-C [facility] Pre-Surgical Testing [phone]

## 2023-05-07 NOTE — Care Plan (Signed)
 Ortho Bundle Case Management Note  Patient Details  Name: Luke Wood MRN: [id number] Date of Birth: Mar 18, 1951 Spoke with patient prior to surgery. Will discharge to home with family to assist. Has DME. OPPT set up with [facility] [place]. Patient and MD in agreement with this. Choice offered                     DME Arranged:    DME Agency:     HH Arranged:    HH Agency:     Additional Comments: Please contact me with any questions of if this plan should need to change.  Charlies Pitch,  RN,BSN,MHA,CCM  [facility] Orthopaedic Specialist  [phone] 05/07/2023, 5:18 PM

## 2023-05-07 NOTE — H&P (Signed)
 TOTAL HIP ADMISSION H&P  Patient is admitted for right total hip arthroplasty.  Subjective:  Chief Complaint: right hip pain  HPI: Luke Wood, 73 y.o. male, has a history of pain and functional disability in the right hip(s) due to arthritis and patient has failed non-surgical conservative treatments for greater than 12 weeks to include NSAID's and/or analgesics, flexibility and strengthening excercises, supervised PT with diminished ADL's post treatment, use of assistive devices, weight reduction as appropriate, and activity modification.  Onset of symptoms was gradual starting 5 years ago with gradually worsening course since that time.The patient noted no past surgery on the right hip(s).  Patient currently rates pain in the right hip at 10 out of 10 with activity. Patient has night pain, worsening of pain with activity and weight bearing, trendelenberg gait, pain that interfers with activities of daily living, and crepitus. Patient has evidence of subchondral cysts, subchondral sclerosis, periarticular osteophytes, and joint space narrowing by imaging studies. This condition presents safety issues increasing the risk of falls. There is no current active infection.  Patient Active Problem List   Diagnosis Date Noted   Primary localized osteoarthritis of left hip 06/15/2020   Past Medical History:  Diagnosis Date   Anxiety    Arthritis    Chronic kidney disease (CKD), stage III (moderate) (HCC)    Coronary artery disease    GERD (gastroesophageal reflux disease)    History of kidney stones    Hypertension    Impaired fasting glucose     Past Surgical History:  Procedure Laterality Date   CARDIAC CATHETERIZATION     CHOLECYSTECTOMY     HERNIA REPAIR     TOTAL HIP ARTHROPLASTY Left 06/15/2020   Procedure: LEFT TOTAL HIP ARTHROPLASTY ANTERIOR APPROACH;  Surgeon: Sheril Coy, MD;  Location[facility];  Service: Orthopedics;  Laterality: Left;    No current facility-administered  medications for this encounter.   Current Outpatient Medications  Medication Sig Dispense Refill Last Dose/Taking   acetaminophen  (TYLENOL ) 325 MG tablet Take 325 mg by mouth every 6 (six) hours as needed for moderate pain.   Taking As Needed   acidophilus (RISAQUAD) CAPS capsule Take 1 capsule by mouth 2 (two) times a week.   Taking   atorvastatin (LIPITOR) 40 MG tablet Take 40 mg by mouth daily.   Taking   benazepril (LOTENSIN) 5 MG tablet Take 5 mg by mouth daily with lunch.   Taking   carboxymethylcellulose (REFRESH PLUS) 0.5 % SOLN Place 1 drop into both eyes 3 (three) times daily as needed (dry eyes).   Taking As Needed   carvedilol (COREG) 25 MG tablet Take 1 tablet by mouth 2 (two) times daily with a meal.   Taking   famotidine (PEPCID) 20 MG tablet Take 20 mg by mouth 2 (two) times daily as needed for heartburn or indigestion.   Taking As Needed   Flaxseed, Linseed, (FLAXSEED OIL) 1200 MG CAPS Take 1,200 mg by mouth daily.   Taking   Magnesium 250 MG TABS Take 250 mg by mouth daily.   Taking   Multiple Vitamins-Minerals (MULTIVITAMIN WITH MINERALS) tablet Take 1 tablet by mouth daily.   Taking   probenecid (BENEMID) 500 MG tablet Take 250 mg by mouth daily.   Taking   tamsulosin  (FLOMAX ) 0.4 MG CAPS capsule Take 0.4 mg by mouth daily.   Taking   TURMERIC PO Take 1 capsule by mouth every 7 (seven) days.   Taking   vitamin B-12 (CYANOCOBALAMIN) 1000 MCG  tablet Take 1,000 mcg by mouth daily.   Taking   Allergies  Allergen Reactions   Amlodipine Swelling    Gum swelling   Clarithromycin Other (See Comments)    Social History   Tobacco Use   Smoking status: Never   Smokeless tobacco: Never  Substance Use Topics   Alcohol  use: Never    No family history on file.   Review of Systems  Musculoskeletal:  Positive for arthralgias.       Right hip  All other systems reviewed and are negative.   Objective:  Physical Exam Constitutional:      Appearance: Normal appearance.   HENT:     Head: Normocephalic and atraumatic.     Nose: Nose normal.     Mouth/Throat:     Pharynx: Oropharynx is clear.  Eyes:     Extraocular Movements: Extraocular movements intact.  Cardiovascular:     Rate and Rhythm: Normal rate and regular rhythm.  Pulmonary:     Effort: Pulmonary effort is normal.  Abdominal:     Palpations: Abdomen is soft.  Musculoskeletal:     Cervical back: Normal range of motion.     Comments: Right hip motion is limited and very painful.  He is walking with a limp.  Leg lengths seem equal.  Straight leg raise is negative.  He has normal unlabored respirations.    Skin:    General: Skin is warm and dry.  Neurological:     General: No focal deficit present.     Mental Status: He is alert and oriented to person, place, and time.  Psychiatric:        Mood and Affect: Mood normal.        Behavior: Behavior normal.        Thought Content: Thought content normal.        Judgment: Judgment normal.     Vital signs in last 24 hours:    Labs:   Estimated body mass index is 22.74 kg/m as calculated from the following:   Height as of 04/30/23: 5' 9 (1.753 m).   Weight as of 04/30/23: 69.9 kg.   Imaging Review Plain radiographs demonstrate severe degenerative joint disease of the right hip(s). The bone quality appears to be good for age and reported activity level.      Assessment/Plan:  End stage primary arthritis, right hip(s)  The patient history, physical examination, clinical judgement of the provider and imaging studies are consistent with end stage degenerative joint disease of the right hip(s) and total hip arthroplasty is deemed medically necessary. The treatment options including medical management, injection therapy, arthroscopy and arthroplasty were discussed at length. The risks and benefits of total hip arthroplasty were presented and reviewed. The risks due to aseptic loosening, infection, stiffness, dislocation/subluxation,   thromboembolic complications and other imponderables were discussed.  The patient acknowledged the explanation, agreed to proceed with the plan and consent was signed. Patient is being admitted for inpatient treatment for surgery, pain control, PT, OT, prophylactic antibiotics, VTE prophylaxis, progressive ambulation and ADL's and discharge planning.The patient is planning to be discharged home with home health services

## 2023-05-08 ENCOUNTER — Encounter (HOSPITAL_COMMUNITY)
Admission: RE | Disposition: A | Discharge status: Home / Self Care | Payer: Self-pay | Source: Ambulatory Visit | Attending: Orthopaedic Surgery

## 2023-05-08 ENCOUNTER — Other Ambulatory Visit: Payer: Self-pay

## 2023-05-08 ENCOUNTER — Ambulatory Visit (HOSPITAL_BASED_OUTPATIENT_CLINIC_OR_DEPARTMENT_OTHER): Payer: Medicare Other | Admitting: Anesthesiology

## 2023-05-08 ENCOUNTER — Ambulatory Visit (HOSPITAL_COMMUNITY): Payer: Medicare Other

## 2023-05-08 ENCOUNTER — Ambulatory Visit (HOSPITAL_COMMUNITY): Payer: Self-pay | Admitting: Physician Assistant

## 2023-05-08 ENCOUNTER — Encounter (HOSPITAL_COMMUNITY): Payer: Self-pay | Admitting: Orthopaedic Surgery

## 2023-05-08 ENCOUNTER — Ambulatory Visit (HOSPITAL_COMMUNITY)
Admission: RE | Admit: 2023-05-08 | Discharge: 2023-05-08 | Disposition: A | Discharge status: Home / Self Care | Payer: Medicare Other | Source: Ambulatory Visit | Attending: Orthopaedic Surgery | Admitting: Orthopaedic Surgery

## 2023-05-08 DIAGNOSIS — M1611 Unilateral primary osteoarthritis, right hip: Secondary | ICD-10-CM

## 2023-05-08 DIAGNOSIS — N183 Chronic kidney disease, stage 3 unspecified: Secondary | ICD-10-CM | POA: Insufficient documentation

## 2023-05-08 DIAGNOSIS — Z96642 Presence of left artificial hip joint: Secondary | ICD-10-CM | POA: Diagnosis not present

## 2023-05-08 DIAGNOSIS — I129 Hypertensive chronic kidney disease with stage 1 through stage 4 chronic kidney disease, or unspecified chronic kidney disease: Secondary | ICD-10-CM | POA: Insufficient documentation

## 2023-05-08 DIAGNOSIS — K219 Gastro-esophageal reflux disease without esophagitis: Secondary | ICD-10-CM | POA: Diagnosis not present

## 2023-05-08 DIAGNOSIS — I2510 Atherosclerotic heart disease of native coronary artery without angina pectoris: Secondary | ICD-10-CM | POA: Diagnosis not present

## 2023-05-08 DIAGNOSIS — Z96641 Presence of right artificial hip joint: Secondary | ICD-10-CM | POA: Diagnosis not present

## 2023-05-08 DIAGNOSIS — Z79899 Other long term (current) drug therapy: Secondary | ICD-10-CM | POA: Insufficient documentation

## 2023-05-08 HISTORY — PX: TOTAL HIP ARTHROPLASTY: SHX124

## 2023-05-08 LAB — TYPE AND SCREEN
ABO/RH(D): AB POS
Antibody Screen: NEGATIVE

## 2023-05-08 SURGERY — ARTHROPLASTY, HIP, TOTAL, ANTERIOR APPROACH
Anesthesia: Spinal | Site: Hip | Laterality: Right

## 2023-05-08 MED ORDER — MIDAZOLAM HCL 5 MG/5ML IJ SOLN
INTRAMUSCULAR | Status: DC | PRN
  Administered 2023-05-08: 2 mg via INTRAVENOUS

## 2023-05-08 MED ORDER — KETOROLAC TROMETHAMINE 15 MG/ML IJ SOLN
INTRAMUSCULAR | Status: AC
  Filled 2023-05-08: qty 1

## 2023-05-08 MED ORDER — TRANEXAMIC ACID 1000 MG/10ML IV SOLN
2000.0000 mg | INTRAVENOUS | Status: DC
  Filled 2023-05-08: qty 20

## 2023-05-08 MED ORDER — BUPIVACAINE LIPOSOME 1.3 % IJ SUSP
INTRAMUSCULAR | Status: AC
  Filled 2023-05-08: qty 10

## 2023-05-08 MED ORDER — MIDAZOLAM HCL 2 MG/2ML IJ SOLN
INTRAMUSCULAR | Status: AC
  Filled 2023-05-08: qty 2

## 2023-05-08 MED ORDER — CHLORHEXIDINE GLUCONATE 0.12 % MT SOLN
15.0000 mL | Freq: Once | OROMUCOSAL | Status: AC
Start: 2023-05-08 — End: 2023-05-08
  Administered 2023-05-08: 15 mL via OROMUCOSAL

## 2023-05-08 MED ORDER — MORPHINE SULFATE (PF) 2 MG/ML IV SOLN: 0.5000 mg | INTRAVENOUS | Status: DC | PRN

## 2023-05-08 MED ORDER — METOCLOPRAMIDE HCL 5 MG/ML IJ SOLN: 5.0000 mg | Freq: Three times a day (TID) | INTRAMUSCULAR | Status: DC | PRN

## 2023-05-08 MED ORDER — CEFAZOLIN SODIUM-DEXTROSE 2-4 GM/100ML-% IV SOLN
2.0000 g | INTRAVENOUS | Status: AC
  Administered 2023-05-08: 2 g via INTRAVENOUS
  Filled 2023-05-08: qty 100

## 2023-05-08 MED ORDER — OXYCODONE HCL 5 MG/5ML PO SOLN: 5.0000 mg | Freq: Once | ORAL | Status: DC | PRN

## 2023-05-08 MED ORDER — HYDROCODONE-ACETAMINOPHEN 7.5-325 MG PO TABS: 1.0000 | ORAL_TABLET | ORAL | Status: DC | PRN

## 2023-05-08 MED ORDER — ONDANSETRON HCL 4 MG/2ML IJ SOLN
INTRAMUSCULAR | Status: AC
  Filled 2023-05-08: qty 2

## 2023-05-08 MED ORDER — LACTATED RINGERS IV SOLN: INTRAVENOUS | Status: DC

## 2023-05-08 MED ORDER — TRANEXAMIC ACID-NACL 1000-0.7 MG/100ML-% IV SOLN
INTRAVENOUS | Status: AC
  Filled 2023-05-08: qty 100

## 2023-05-08 MED ORDER — ACETAMINOPHEN 500 MG PO TABS
ORAL_TABLET | ORAL | Status: AC
  Filled 2023-05-08: qty 1

## 2023-05-08 MED ORDER — KETOROLAC TROMETHAMINE 15 MG/ML IJ SOLN
7.5000 mg | Freq: Four times a day (QID) | INTRAMUSCULAR | Status: DC
  Administered 2023-05-08: 7.5 mg via INTRAVENOUS

## 2023-05-08 MED ORDER — POVIDONE-IODINE 10 % EX SWAB: 2.0000 | Freq: Once | CUTANEOUS | Status: DC

## 2023-05-08 MED ORDER — ORAL CARE MOUTH RINSE: 15.0000 mL | Freq: Once | OROMUCOSAL | Status: AC

## 2023-05-08 MED ORDER — PROPOFOL 500 MG/50ML IV EMUL
INTRAVENOUS | Status: DC | PRN
  Administered 2023-05-08: 80 ug/kg/min via INTRAVENOUS

## 2023-05-08 MED ORDER — HYDROMORPHONE HCL 1 MG/ML IJ SOLN: 0.2500 mg | INTRAMUSCULAR | Status: DC | PRN

## 2023-05-08 MED ORDER — BUPIVACAINE IN DEXTROSE 0.75-8.25 % IT SOLN
INTRATHECAL | Status: DC | PRN
  Administered 2023-05-08: 1.8 mL via INTRATHECAL

## 2023-05-08 MED ORDER — TRANEXAMIC ACID-NACL 1000-0.7 MG/100ML-% IV SOLN
1000.0000 mg | Freq: Once | INTRAVENOUS | Status: AC
  Administered 2023-05-08: 1000 mg via INTRAVENOUS

## 2023-05-08 MED ORDER — 0.9 % SODIUM CHLORIDE (POUR BTL) OPTIME
TOPICAL | Status: DC | PRN
  Administered 2023-05-08: 1000 mL

## 2023-05-08 MED ORDER — DEXAMETHASONE SODIUM PHOSPHATE 10 MG/ML IJ SOLN
INTRAMUSCULAR | Status: AC
  Filled 2023-05-08: qty 1

## 2023-05-08 MED ORDER — METOCLOPRAMIDE HCL 5 MG PO TABS: 5.0000 mg | ORAL_TABLET | Freq: Three times a day (TID) | ORAL | Status: DC | PRN

## 2023-05-08 MED ORDER — LACTATED RINGERS IV BOLUS: 250.0000 mL | Freq: Once | INTRAVENOUS | Status: DC

## 2023-05-08 MED ORDER — ASPIRIN 81 MG PO TBEC: 81.0000 mg | DELAYED_RELEASE_TABLET | Freq: Two times a day (BID) | ORAL | 0 refills | Status: AC

## 2023-05-08 MED ORDER — BUPIVACAINE-EPINEPHRINE (PF) 0.25% -1:200000 IJ SOLN
INTRAMUSCULAR | Status: DC | PRN
  Administered 2023-05-08: 30 mL via PERINEURAL

## 2023-05-08 MED ORDER — PROPOFOL 10 MG/ML IV BOLUS
INTRAVENOUS | Status: DC | PRN
  Administered 2023-05-08: 10 mg via INTRAVENOUS

## 2023-05-08 MED ORDER — ACETAMINOPHEN 500 MG PO TABS
500.0000 mg | ORAL_TABLET | Freq: Four times a day (QID) | ORAL | Status: DC
  Administered 2023-05-08: 500 mg via ORAL

## 2023-05-08 MED ORDER — PROPOFOL 1000 MG/100ML IV EMUL
INTRAVENOUS | Status: AC
  Filled 2023-05-08: qty 100

## 2023-05-08 MED ORDER — TRANEXAMIC ACID 1000 MG/10ML IV SOLN
INTRAVENOUS | Status: DC | PRN
  Administered 2023-05-08: 2000 mg via TOPICAL

## 2023-05-08 MED ORDER — ONDANSETRON HCL 4 MG/2ML IJ SOLN: 4.0000 mg | Freq: Four times a day (QID) | INTRAMUSCULAR | Status: DC | PRN

## 2023-05-08 MED ORDER — LACTATED RINGERS IV BOLUS
500.0000 mL | Freq: Once | INTRAVENOUS | Status: AC
  Administered 2023-05-08: 500 mL via INTRAVENOUS

## 2023-05-08 MED ORDER — METHOCARBAMOL 1000 MG/10ML IJ SOLN: 500.0000 mg | Freq: Four times a day (QID) | INTRAMUSCULAR | Status: DC | PRN

## 2023-05-08 MED ORDER — SODIUM CHLORIDE 0.9 % IV SOLN: 12.5000 mg | INTRAVENOUS | Status: DC | PRN

## 2023-05-08 MED ORDER — TRANEXAMIC ACID-NACL 1000-0.7 MG/100ML-% IV SOLN
1000.0000 mg | INTRAVENOUS | Status: AC
  Administered 2023-05-08: 1000 mg via INTRAVENOUS
  Filled 2023-05-08: qty 100

## 2023-05-08 MED ORDER — BUPIVACAINE LIPOSOME 1.3 % IJ SUSP
INTRAMUSCULAR | Status: DC | PRN
  Administered 2023-05-08: 10 mL

## 2023-05-08 MED ORDER — METHOCARBAMOL 500 MG PO TABS: 500.0000 mg | ORAL_TABLET | Freq: Four times a day (QID) | ORAL | Status: DC | PRN

## 2023-05-08 MED ORDER — OXYCODONE HCL 5 MG PO TABS: 5.0000 mg | ORAL_TABLET | Freq: Once | ORAL | Status: DC | PRN

## 2023-05-08 MED ORDER — CEFAZOLIN SODIUM-DEXTROSE 2-4 GM/100ML-% IV SOLN: 2.0000 g | Freq: Four times a day (QID) | INTRAVENOUS | Status: DC

## 2023-05-08 MED ORDER — ONDANSETRON HCL 4 MG PO TABS: 4.0000 mg | ORAL_TABLET | Freq: Four times a day (QID) | ORAL | Status: DC | PRN

## 2023-05-08 MED ORDER — CEFAZOLIN SODIUM-DEXTROSE 2-4 GM/100ML-% IV SOLN
INTRAVENOUS | Status: AC
  Administered 2023-05-08: 2 g via INTRAVENOUS
  Filled 2023-05-08: qty 100

## 2023-05-08 MED ORDER — BUPIVACAINE-EPINEPHRINE 0.25% -1:200000 IJ SOLN
INTRAMUSCULAR | Status: AC
  Filled 2023-05-08: qty 1

## 2023-05-08 MED ORDER — HYDROCODONE-ACETAMINOPHEN 5-325 MG PO TABS: 1.0000 | ORAL_TABLET | Freq: Four times a day (QID) | ORAL | 0 refills | Status: AC | PRN

## 2023-05-08 MED ORDER — LACTATED RINGERS IV BOLUS
250.0000 mL | Freq: Once | INTRAVENOUS | Status: AC
  Administered 2023-05-08: 250 mL via INTRAVENOUS

## 2023-05-08 MED ORDER — TIZANIDINE HCL 4 MG PO TABS: 4.0000 mg | ORAL_TABLET | Freq: Four times a day (QID) | ORAL | 1 refills | Status: AC | PRN

## 2023-05-08 MED ORDER — PHENYLEPHRINE HCL-NACL 20-0.9 MG/250ML-% IV SOLN
INTRAVENOUS | Status: DC | PRN
  Administered 2023-05-08: 50 ug/min via INTRAVENOUS

## 2023-05-08 SURGICAL SUPPLY — 50 items
BAG COUNTER SPONGE SURGICOUNT (BAG) IMPLANT
BAG DECANTER FOR FLEXI CONT (MISCELLANEOUS) ×1 IMPLANT
BLADE SAW SGTL 18X1.27X75 (BLADE) ×1 IMPLANT
BOOTIES KNEE HIGH SLOAN (MISCELLANEOUS) ×1 IMPLANT
COVER PERINEAL POST (MISCELLANEOUS) ×1 IMPLANT
COVER SURGICAL LIGHT HANDLE (MISCELLANEOUS) ×1 IMPLANT
CUP ACETABULAR GRIPTON 100 52 (Orthopedic Implant) IMPLANT
DRAPE FOOT SWITCH (DRAPES) ×1 IMPLANT
DRAPE IMP U-DRAPE 54X76 (DRAPES) ×1 IMPLANT
DRAPE STERI IOBAN 125X83 (DRAPES) ×1 IMPLANT
DRAPE U-SHAPE 47X51 STRL (DRAPES) ×1 IMPLANT
DRSG AQUACEL AG ADV 3.5X 6 (GAUZE/BANDAGES/DRESSINGS) ×1 IMPLANT
DURAPREP 26ML APPLICATOR (WOUND CARE) ×1 IMPLANT
ELECT BLADE TIP CTD 4 INCH (ELECTRODE) ×1 IMPLANT
ELECT REM PT RETURN 15FT ADLT (MISCELLANEOUS) ×1 IMPLANT
ELIMINATOR HOLE APEX DEPUY (Hips) IMPLANT
GLOVE BIO SURGEON STRL SZ8 (GLOVE) ×2 IMPLANT
GLOVE BIOGEL PI IND STRL 7.0 (GLOVE) ×1 IMPLANT
GLOVE BIOGEL PI IND STRL 8 (GLOVE) ×2 IMPLANT
GLOVE SURG SYN 7.0 (GLOVE) ×1 IMPLANT
GLOVE SURG SYN 7.0 PF PI (GLOVE) ×1 IMPLANT
GOWN SRG XL LVL 4 BRTHBL STRL (GOWNS) ×1 IMPLANT
GOWN STRL REUS W/ TWL XL LVL3 (GOWN DISPOSABLE) ×2 IMPLANT
GRIPTON 100 52 (Orthopedic Implant) ×1 IMPLANT
HEAD M SROM 36MM 2 (Hips) IMPLANT
HEAD M SROM 36MM PLUS 1.5 (Hips) IMPLANT
HOLDER FOLEY CATH W/STRAP (MISCELLANEOUS) ×1 IMPLANT
KIT TURNOVER KIT A (KITS) IMPLANT
LINER NEUTRAL 52X36MM PLUS 4 (Liner) IMPLANT
MANIFOLD NEPTUNE II (INSTRUMENTS) ×1 IMPLANT
NDL HYPO 22X1.5 SAFETY MO (MISCELLANEOUS) ×1 IMPLANT
NEEDLE HYPO 22X1.5 SAFETY MO (MISCELLANEOUS) ×1 IMPLANT
NS IRRIG 1000ML POUR BTL (IV SOLUTION) ×1 IMPLANT
PACK ANTERIOR HIP CUSTOM (KITS) ×1 IMPLANT
PROTECTOR NERVE ULNAR (MISCELLANEOUS) ×1 IMPLANT
RETRACTOR WND ALEXIS 18 MED (MISCELLANEOUS) ×1 IMPLANT
RTRCTR WOUND ALEXIS 18CM MED (MISCELLANEOUS) ×1 IMPLANT
SPIKE FLUID TRANSFER (MISCELLANEOUS) ×1 IMPLANT
SROM M HEAD 36MM 2 (Hips) IMPLANT
SROM M HEAD 36MM PLUS 1.5 (Hips) ×1 IMPLANT
STEM FEMORAL SZ 5MM STD ACTIS (Stem) IMPLANT
SUT ETHIBOND NAB CT1 #1 30IN (SUTURE) ×2 IMPLANT
SUT STRATAFIX 0 PDS 27 VIOLET (SUTURE) ×1 IMPLANT
SUT VIC AB 1 CT1 36 (SUTURE) ×1 IMPLANT
SUT VIC AB 2-0 CT1 TAPERPNT 27 (SUTURE) ×1 IMPLANT
SUT VICRYL AB 3-0 FS1 BRD 27IN (SUTURE) ×1 IMPLANT
SUTURE STRATFX 0 PDS 27 VIOLET (SUTURE) ×1 IMPLANT
SYR 50ML LL SCALE MARK (SYRINGE) ×1 IMPLANT
TRAY FOLEY MTR SLVR 16FR STAT (SET/KITS/TRAYS/PACK) ×1 IMPLANT
YANKAUER SUCT BULB TIP NO VENT (SUCTIONS) ×1 IMPLANT

## 2023-05-08 NOTE — Evaluation (Signed)
 Physical Therapy Evaluation Patient Details Name: Luke Wood MRN: [id number] DOB: Dec 27, 1950 Today's Date: 05/08/2023  History of Present Illness  pt 73 yo in PACU s/p L THA PMHof RTHA 05/2020  Clinical Impression  Pt assessed in PACU for DC safety s/p   Luke Wood is a 73 y.o.  POD 0 s/p LTHA. Patient reports pain with mobility at baseline. Patient is now limited by functional impairments (see PT problem list below) and requires supervision for transfers and gait with RW. Patient was able to ambulate 50 feet with RW with supervision and cues for safe walker management. Patient educated on safe sequencing for stair mobility and verbalized safe guarding position for people assisting with mobility. Patient instructed in exercises to facilitate ROM and circulation. Also performed stair training and education. Patient has met mobility goals at adequate level for discharge home; will continue to follow if pt continues acute stay to progress towards Mod I goals.        If plan is discharge home, recommend the following: A little help with walking and/or transfers;A little help with bathing/dressing/bathroom;Assistance with cooking/housework;Help with stairs or ramp for entrance   Can travel by private vehicle        Equipment Recommendations None recommended by PT  Recommendations for Other Services       Functional Status Assessment Patient has had a recent decline in their functional status and demonstrates the ability to make significant improvements in function in a reasonable and predictable amount of time.     Precautions / Restrictions Precautions Precautions: None Restrictions Weight Bearing Restrictions Per Provider Order: No      Mobility  Bed Mobility Overal bed mobility: Needs Assistance Bed Mobility: Supine to Sit, Sit to Supine     Supine to sit: Contact guard     General bed mobility comments: pt able to perform with increased time and cues for guidance and  sequence    Transfers Overall transfer level: Needs assistance Equipment used: Rolling walker (2 wheels) Transfers: Sit to/from Stand Sit to Stand: Contact guard assist                Ambulation/Gait Ambulation/Gait assistance: Contact guard assist Gait Distance (Feet): 50 Feet Assistive device: Rolling walker (2 wheels) Gait Pattern/deviations: Step-to pattern       General Gait Details: educated on step to pattern this first night to assure strength in the Left LE then can progress to step through pattern after first night. stay on RW for as long as need to assure you ahve good regular pait pattern ( usually 1-2 weeks )  Stairs Stairs: Yes Stairs assistance: Contact guard assist Stair Management: One rail Right, Step to pattern Number of Stairs: 3 General stair comments: performed steps 2x , step to pattern facing rail using both hands on rail. Aslo educated brother about how to use foled RW on stesp , but also encourage HHPT to review this with patient.  Wheelchair Mobility     Tilt Bed    Modified Rankin (Stroke Patients Only)       Balance                                             Pertinent Vitals/Pain Pain Assessment Pain Assessment: No/denies pain    Home Living Family/patient expects to be discharged to:: Private residence (will go to brothers house with  level entry for over the next several days) Living Arrangements: Alone Available Help at Discharge: Family Type of Home: House Home Access: Level entry       Home Layout: One level Home Equipment: Agricultural Consultant (2 wheels) Additional Comments: pt will go to his brohters house first for several days with his borther ot assist. Then pt will go to his apartment which does have a flight of steps to enter inside prior to his door.    Prior Function Prior Level of Function : Independent/Modified Independent;Working/employed             Mobility Comments: very active and  likes to walk 3 miles a day .       Extremity/Trunk Assessment        Lower Extremity Assessment Lower Extremity Assessment: LLE deficits/detail LLE Deficits / Details: able to perform heelp slides with hip flexion with AAROM , min assist at this time       Communication   Communication Communication: No apparent difficulties  Cognition Arousal: Alert Behavior During Therapy: WFL for tasks assessed/performed Overall Cognitive Status: Within Functional Limits for tasks assessed                                          General Comments      Exercises Total Joint Exercises Ankle Circles/Pumps: AROM, Both, 10 reps, Supine Quad Sets: AROM, Left, 10 reps, Supine Heel Slides: AROM, Left, 5 reps, Supine Hip ABduction/ADduction: AAROM, Left, 5 reps, Supine   Assessment/Plan    PT Assessment Patient needs continued PT services  PT Problem List Decreased mobility       PT Treatment Interventions DME instruction;Gait training;Stair training;Therapeutic activities;Therapeutic exercise    PT Goals (Current goals can be found in the Care Plan section)  Acute Rehab PT Goals Patient Stated Goal: I want to get back to walking 3 miles , 3x a week PT Goal Formulation: All assessment and education complete, DC therapy Potential to Achieve Goals: Good    Frequency 7X/week     Co-evaluation               AM-PAC PT 6 Clicks Mobility  Outcome Measure Help needed turning from your back to your side while in a flat bed without using bedrails?: A Little Help needed moving from lying on your back to sitting on the side of a flat bed without using bedrails?: A Little Help needed moving to and from a bed to a chair (including a wheelchair)?: A Little Help needed standing up from a chair using your arms (e.g., wheelchair or bedside chair)?: A Little Help needed to walk in hospital room?: A Little Help needed climbing 3-5 steps with a railing? : A Little 6 Click  Score: 18    End of Session Equipment Utilized During Treatment: Gait belt Activity Tolerance: Patient tolerated treatment well Patient left: in chair;with family/visitor present;with call bell/phone within reach Nurse Communication: Mobility status PT Visit Diagnosis: Unsteadiness on feet (R26.81)    Time: 1730-1730 PT Time Calculation (min) (ACUTE ONLY): 0 min   Charges:   PT Evaluation $PT Eval Low Complexity: 1 Low PT Treatments $Gait Training: 8-22 mins $Therapeutic Exercise: 8-22 mins PT General Charges $$ ACUTE PT VISIT: 1 Visit         Cadi Rhinehart, PT, MPT Acute Rehabilitation Services Office: [phone] If a weekend: secure chat groups: [facility] PT, [facility] OT,  [facility] SLP 05/08/2023   [employer] 05/08/2023, 8:36 PM

## 2023-05-08 NOTE — Anesthesia Procedure Notes (Signed)
 Spinal  Patient location during procedure: OR Start time: 05/08/2023 12:08 PM End time: 05/08/2023 12:10 PM Reason for block: surgical anesthesia Staffing Performed: resident/CRNA  Anesthesiologist: Carleton Garnette SAUNDERS, CRNA Performed by: Carleton Garnette SAUNDERS, CRNA Authorized by: Carleton Garnette SAUNDERS, CRNA   Preanesthetic Checklist Completed: patient identified, IV checked, site marked, risks and benefits discussed, surgical consent, monitors and equipment checked, pre-op evaluation and timeout performed Spinal Block Patient position: sitting Prep: DuraPrep Patient monitoring: heart rate, cardiac monitor, continuous pulse ox and blood pressure Approach: midline Location: L3-4 Injection technique: single-shot Needle Needle type: Pencan  Needle gauge: 24 G Needle length: 10 cm Needle insertion depth: 7 cm Assessment Sensory level: T6 Events: CSF return Additional Notes Timeout performed. Patient in sitting position. L3-4 identified. Cleansed with Duraprep. SAB without difficulty. To supine position

## 2023-05-08 NOTE — Op Note (Signed)
 PRE-OP DIAGNOSIS:  RIGHT HIP DEGENERATIVE JOINT DISEASE POST-OP DIAGNOSIS:  same PROCEDURE: RIGHT TOTAL HIP ARTHROPLASTY ANTERIOR APPROACH ANESTHESIA:  Spinal and MAC SURGEON:  Maude Herald MD ASSISTANT:  Prentice Earl PA-C   INDICATIONS FOR PROCEDURE:  The patient is a 73 y.o. male with a long history of a painful hip.  This has persisted despite multiple conservative measures.  The patient has persisted with pain and dysfunction making rest and activity difficult.  A total hip replacement is offered as surgical treatment.  Informed operative consent was obtained after discussion of possible complications including reaction to anesthesia, infection, neurovascular injury, dislocation, DVT, PE, and death.  The importance of the postoperative rehab program to optimize result was stressed with the patient.  SUMMARY OF FINDINGS AND PROCEDURE:  Under the above anesthesia through a anterior approach an the Hana table a right THR was performed.  The patient had severe degenerative change and excellent bone quality.  We used DePuy components to replace the hip and these were size 5 Actis femur capped with a +1.59mm metal hip ball.  On the acetabular side we used a size 52 Gription shell with a  plus 4 neutral polyethylene liner.  We did use a hole eliminator.  Prentice Earl PA-C assisted throughout and was invaluable to the completion of the case in that he helped position and retract while I performed the procedure.  He also closed simultaneously to help minimize OR time.  I used fluoroscopy throughout the case to check position of implants and leg lengths and read all of these views myself.  DESCRIPTION OF PROCEDURE:  The patient was taken to the OR suite where the above anesthetic was applied.  The patient was then positioned on the Hana table supine.  All bony prominences were appropriately padded.  Prep and drape was then performed in normal sterile fashion.  The patient was given kefzol  preoperative  antibiotic and an appropriate time out was performed.  We then took an anterior approach to the right hip.  Dissection was taken through adipose to the tensor fascia lata fascia.  This structure was incised longitudinally and we dissected in the intermuscular interval just medial to this muscle.  Cobra retractors were placed superior and inferior to the femoral neck superficial to the capsule.  A capsular incision was then made and the retractors were placed along the femoral neck.  Xray was brought in to get a good level for the femoral neck cut which was made with an oscillating saw and osteotome.  The femoral head was removed with a corkscrew.  The acetabulum was exposed and some labral tissues were excised. Reaming was taken to the inside wall of the pelvis and sequentially up to 1 mm smaller than the actual component.  A trial of components was done and then the aforementioned acetabular shell was placed in appropriate tilt and anteversion confirmed by fluoroscopy. The liner was placed along with the hole eliminator and attention was turned to the femur.  The leg was brought down and over into adduction and the elevator bar was used to raise the femur up gently in the wound.  The piriformis was released with care taken to preserve the obturator internus attachment and all of the posterior capsule. The femur was reamed and then broached to the appropriate size.  A trial reduction was done and the aforementioned head and neck assembly gave us  the best stability in extension with external rotation.  Leg lengths were felt to be about equal by  fluoroscopic exam.  The trial components were removed and the wound irrigated.  We then placed the femoral component in appropriate anteversion.  The head was applied to a dry stem neck and the hip again reduced.  It was again stable in the aforementioned position.  The would was irrigated again followed by re-approximation of anterior capsule with ethibond suture. Tensor  fascia was repaired with V-loc suture  followed by deep closure with #O and #2 undyed vicryl.  Skin was closed with subQ stitch and steristrips followed by a sterile dressing.  EBL and IOF can be obtained from anesthesia records.  DISPOSITION:  The patient was taken to PACU in stable condition to potentially go home same day depending on ability to walk and tolerate liquids.

## 2023-05-08 NOTE — Anesthesia Preprocedure Evaluation (Signed)
 Anesthesia Evaluation  Patient identified by MRN, date of birth, ID band Patient awake    Reviewed: Allergy & Precautions, NPO status , Patient's Chart, lab work & pertinent test results  History of Anesthesia Complications Negative for: history of anesthetic complications  Airway Mallampati: II  TM Distance: >3 FB Neck ROM: Full    Dental no notable dental hx. (+) Dental Advisory Given   Pulmonary neg pulmonary ROS   Pulmonary exam normal        Cardiovascular hypertension, Pt. on home beta blockers and Pt. on medications + CAD  Normal cardiovascular exam     Neuro/Psych   Anxiety     negative neurological ROS     GI/Hepatic Neg liver ROS,GERD  ,,  Endo/Other  negative endocrine ROS    Renal/GU Renal InsufficiencyRenal disease     Musculoskeletal  (+) Arthritis , Osteoarthritis,    Abdominal   Peds  Hematology negative hematology ROS (+)   Anesthesia Other Findings   Reproductive/Obstetrics                             Anesthesia Physical Anesthesia Plan  ASA: II  Anesthesia Plan: Spinal   Post-op Pain Management:    Induction:   PONV Risk Score and Plan: 1 and Ondansetron  and Propofol  infusion  Airway Management Planned: Natural Airway  Additional Equipment:   Intra-op Plan:   Post-operative Plan:   Informed Consent: I have reviewed the patients History and Physical, chart, labs and discussed the procedure including the risks, benefits and alternatives for the proposed anesthesia with the patient or authorized representative who has indicated his/her understanding and acceptance.     Dental advisory given  Plan Discussed with: Anesthesiologist  Anesthesia Plan Comments: (See APP note by RONAL Belt, FNP )        Anesthesia Quick Evaluation

## 2023-05-08 NOTE — Anesthesia Postprocedure Evaluation (Signed)
 Anesthesia Post Note  Patient: Luke Wood  Procedure(s) Performed: RIGHT TOTAL HIP ARTHROPLASTY ANTERIOR APPROACH (Right: Hip)     Patient location during evaluation: PACU Anesthesia Type: Spinal Level of consciousness: awake and alert Pain management: pain level controlled Vital Signs Assessment: post-procedure vital signs reviewed and stable Respiratory status: spontaneous breathing, nonlabored ventilation and respiratory function stable Cardiovascular status: blood pressure returned to baseline and stable Postop Assessment: no apparent nausea or vomiting Anesthetic complications: no   No notable events documented.  Last Vitals:  Vitals:   05/08/23 1505 05/08/23 1541  BP: (!) 132/92   Pulse: 69 70  Resp: 13   Temp: 36.4 C   SpO2: 99% 100%    Last Pain:  Vitals:   05/08/23 1505  TempSrc:   PainSc: 2                  Butler Levander Pinal

## 2023-05-08 NOTE — Transfer of Care (Signed)
 Immediate Anesthesia Transfer of Care Note  Patient: Luke Wood  Procedure(s) Performed: RIGHT TOTAL HIP ARTHROPLASTY ANTERIOR APPROACH (Right: Hip)  Patient Location: PACU  Anesthesia Type:Spinal  Level of Consciousness: sedated  Airway & Oxygen Therapy: Patient Spontanous Breathing and Patient connected to face mask oxygen  Post-op Assessment: Report given to RN and Post -op Vital signs reviewed and stable  Post vital signs: Reviewed and stable  Last Vitals:  Vitals Value Taken Time  BP 99/70 05/08/23 1346  Temp    Pulse 75 05/08/23 1347  Resp 14 05/08/23 1347  SpO2 100 % 05/08/23 1347  Vitals shown include unfiled device data.  Last Pain:  Vitals:   05/08/23 0951  TempSrc: Oral  PainSc:          Complications: No notable events documented.

## 2023-05-08 NOTE — Interval H&P Note (Signed)
 History and Physical Interval Note:  05/08/2023 11:07 AM  Luke Wood  has presented today for surgery, with the diagnosis of right hip degenerative joint disease.  The various methods of treatment have been discussed with the patient and family. After consideration of risks, benefits and other options for treatment, the patient has consented to  Procedure(s): RIGHT TOTAL HIP ARTHROPLASTY ANTERIOR APPROACH (Right) as a surgical intervention.  The patient's history has been reviewed, patient examined, no change in status, stable for surgery.  I have reviewed the patient's chart and labs.  Questions were answered to the patient's satisfaction.     Kalin Amrhein G Kinzie Wickes

## 2023-05-09 ENCOUNTER — Encounter (HOSPITAL_COMMUNITY): Payer: Self-pay | Admitting: Orthopaedic Surgery

## 2023-05-13 DIAGNOSIS — R2689 Other abnormalities of gait and mobility: Secondary | ICD-10-CM | POA: Diagnosis not present

## 2023-05-13 DIAGNOSIS — Z96641 Presence of right artificial hip joint: Secondary | ICD-10-CM | POA: Diagnosis not present

## 2023-05-13 DIAGNOSIS — M25551 Pain in right hip: Secondary | ICD-10-CM | POA: Diagnosis not present

## 2023-05-13 DIAGNOSIS — M6281 Muscle weakness (generalized): Secondary | ICD-10-CM | POA: Diagnosis not present

## 2023-05-15 DIAGNOSIS — R2689 Other abnormalities of gait and mobility: Secondary | ICD-10-CM | POA: Diagnosis not present

## 2023-05-15 DIAGNOSIS — M25551 Pain in right hip: Secondary | ICD-10-CM | POA: Diagnosis not present

## 2023-05-15 DIAGNOSIS — M6281 Muscle weakness (generalized): Secondary | ICD-10-CM | POA: Diagnosis not present

## 2023-05-15 DIAGNOSIS — Z96641 Presence of right artificial hip joint: Secondary | ICD-10-CM | POA: Diagnosis not present

## 2023-05-18 DIAGNOSIS — M1611 Unilateral primary osteoarthritis, right hip: Secondary | ICD-10-CM | POA: Diagnosis not present

## 2023-05-22 DIAGNOSIS — R2689 Other abnormalities of gait and mobility: Secondary | ICD-10-CM | POA: Diagnosis not present

## 2023-05-22 DIAGNOSIS — Z96641 Presence of right artificial hip joint: Secondary | ICD-10-CM | POA: Diagnosis not present

## 2023-05-22 DIAGNOSIS — M25551 Pain in right hip: Secondary | ICD-10-CM | POA: Diagnosis not present

## 2023-05-22 DIAGNOSIS — M6281 Muscle weakness (generalized): Secondary | ICD-10-CM | POA: Diagnosis not present

## 2023-05-24 DIAGNOSIS — Z96641 Presence of right artificial hip joint: Secondary | ICD-10-CM | POA: Diagnosis not present

## 2023-05-24 DIAGNOSIS — R2689 Other abnormalities of gait and mobility: Secondary | ICD-10-CM | POA: Diagnosis not present

## 2023-05-24 DIAGNOSIS — M25551 Pain in right hip: Secondary | ICD-10-CM | POA: Diagnosis not present

## 2023-05-24 DIAGNOSIS — M6281 Muscle weakness (generalized): Secondary | ICD-10-CM | POA: Diagnosis not present

## 2023-05-29 DIAGNOSIS — M6281 Muscle weakness (generalized): Secondary | ICD-10-CM | POA: Diagnosis not present

## 2023-05-29 DIAGNOSIS — M25551 Pain in right hip: Secondary | ICD-10-CM | POA: Diagnosis not present

## 2023-05-29 DIAGNOSIS — R2689 Other abnormalities of gait and mobility: Secondary | ICD-10-CM | POA: Diagnosis not present

## 2023-05-29 DIAGNOSIS — Z96641 Presence of right artificial hip joint: Secondary | ICD-10-CM | POA: Diagnosis not present

## 2023-05-31 DIAGNOSIS — Z96641 Presence of right artificial hip joint: Secondary | ICD-10-CM | POA: Diagnosis not present

## 2023-05-31 DIAGNOSIS — M6281 Muscle weakness (generalized): Secondary | ICD-10-CM | POA: Diagnosis not present

## 2023-05-31 DIAGNOSIS — R2689 Other abnormalities of gait and mobility: Secondary | ICD-10-CM | POA: Diagnosis not present

## 2023-05-31 DIAGNOSIS — M25551 Pain in right hip: Secondary | ICD-10-CM | POA: Diagnosis not present

## 2023-06-07 DIAGNOSIS — Z96641 Presence of right artificial hip joint: Secondary | ICD-10-CM | POA: Diagnosis not present

## 2023-06-07 DIAGNOSIS — R3129 Other microscopic hematuria: Secondary | ICD-10-CM | POA: Diagnosis not present

## 2023-06-07 DIAGNOSIS — R2689 Other abnormalities of gait and mobility: Secondary | ICD-10-CM | POA: Diagnosis not present

## 2023-06-07 DIAGNOSIS — M6281 Muscle weakness (generalized): Secondary | ICD-10-CM | POA: Diagnosis not present

## 2023-06-07 DIAGNOSIS — N401 Enlarged prostate with lower urinary tract symptoms: Secondary | ICD-10-CM | POA: Diagnosis not present

## 2023-06-07 DIAGNOSIS — N390 Urinary tract infection, site not specified: Secondary | ICD-10-CM | POA: Diagnosis not present

## 2023-06-07 DIAGNOSIS — M25551 Pain in right hip: Secondary | ICD-10-CM | POA: Diagnosis not present

## 2023-06-07 DIAGNOSIS — R972 Elevated prostate specific antigen [PSA]: Secondary | ICD-10-CM | POA: Diagnosis not present

## 2023-06-11 DIAGNOSIS — R2689 Other abnormalities of gait and mobility: Secondary | ICD-10-CM | POA: Diagnosis not present

## 2023-06-11 DIAGNOSIS — M25551 Pain in right hip: Secondary | ICD-10-CM | POA: Diagnosis not present

## 2023-06-11 DIAGNOSIS — M6281 Muscle weakness (generalized): Secondary | ICD-10-CM | POA: Diagnosis not present

## 2023-06-11 DIAGNOSIS — Z96641 Presence of right artificial hip joint: Secondary | ICD-10-CM | POA: Diagnosis not present

## 2023-06-19 DIAGNOSIS — M109 Gout, unspecified: Secondary | ICD-10-CM | POA: Diagnosis not present

## 2023-06-19 DIAGNOSIS — I10 Essential (primary) hypertension: Secondary | ICD-10-CM | POA: Diagnosis not present

## 2023-06-19 DIAGNOSIS — E875 Hyperkalemia: Secondary | ICD-10-CM | POA: Diagnosis not present

## 2023-06-19 DIAGNOSIS — N183 Chronic kidney disease, stage 3 unspecified: Secondary | ICD-10-CM | POA: Diagnosis not present

## 2023-06-19 DIAGNOSIS — E7849 Other hyperlipidemia: Secondary | ICD-10-CM | POA: Diagnosis not present

## 2023-06-25 DIAGNOSIS — Z0001 Encounter for general adult medical examination with abnormal findings: Secondary | ICD-10-CM | POA: Diagnosis not present

## 2023-06-25 DIAGNOSIS — N401 Enlarged prostate with lower urinary tract symptoms: Secondary | ICD-10-CM | POA: Diagnosis not present

## 2023-06-25 DIAGNOSIS — Z6822 Body mass index (BMI) 22.0-22.9, adult: Secondary | ICD-10-CM | POA: Diagnosis not present

## 2023-06-25 DIAGNOSIS — F411 Generalized anxiety disorder: Secondary | ICD-10-CM | POA: Diagnosis not present

## 2023-06-25 DIAGNOSIS — Z0000 Encounter for general adult medical examination without abnormal findings: Secondary | ICD-10-CM | POA: Diagnosis not present

## 2023-06-25 DIAGNOSIS — E7849 Other hyperlipidemia: Secondary | ICD-10-CM | POA: Diagnosis not present

## 2023-06-25 DIAGNOSIS — E782 Mixed hyperlipidemia: Secondary | ICD-10-CM | POA: Diagnosis not present

## 2023-06-25 DIAGNOSIS — I10 Essential (primary) hypertension: Secondary | ICD-10-CM | POA: Diagnosis not present

## 2023-06-26 DIAGNOSIS — N401 Enlarged prostate with lower urinary tract symptoms: Secondary | ICD-10-CM | POA: Diagnosis not present

## 2023-06-26 DIAGNOSIS — R972 Elevated prostate specific antigen [PSA]: Secondary | ICD-10-CM | POA: Diagnosis not present

## 2023-06-26 DIAGNOSIS — N390 Urinary tract infection, site not specified: Secondary | ICD-10-CM | POA: Diagnosis not present

## 2023-07-11 DIAGNOSIS — L600 Ingrowing nail: Secondary | ICD-10-CM | POA: Diagnosis not present

## 2023-07-11 DIAGNOSIS — B353 Tinea pedis: Secondary | ICD-10-CM | POA: Diagnosis not present

## 2023-07-11 DIAGNOSIS — M2042 Other hammer toe(s) (acquired), left foot: Secondary | ICD-10-CM | POA: Diagnosis not present

## 2023-07-11 DIAGNOSIS — M2041 Other hammer toe(s) (acquired), right foot: Secondary | ICD-10-CM | POA: Diagnosis not present

## 2023-07-11 DIAGNOSIS — M79671 Pain in right foot: Secondary | ICD-10-CM | POA: Diagnosis not present

## 2023-07-11 DIAGNOSIS — M79672 Pain in left foot: Secondary | ICD-10-CM | POA: Diagnosis not present

## 2023-08-06 DIAGNOSIS — M1611 Unilateral primary osteoarthritis, right hip: Secondary | ICD-10-CM | POA: Diagnosis not present

## 2023-08-28 DIAGNOSIS — I2510 Atherosclerotic heart disease of native coronary artery without angina pectoris: Secondary | ICD-10-CM | POA: Diagnosis not present

## 2023-08-28 DIAGNOSIS — I10 Essential (primary) hypertension: Secondary | ICD-10-CM | POA: Diagnosis not present

## 2023-08-28 DIAGNOSIS — E782 Mixed hyperlipidemia: Secondary | ICD-10-CM | POA: Diagnosis not present

## 2023-09-25 DIAGNOSIS — N138 Other obstructive and reflux uropathy: Secondary | ICD-10-CM | POA: Diagnosis not present

## 2023-09-25 DIAGNOSIS — N401 Enlarged prostate with lower urinary tract symptoms: Secondary | ICD-10-CM | POA: Diagnosis not present

## 2023-10-02 DIAGNOSIS — N401 Enlarged prostate with lower urinary tract symptoms: Secondary | ICD-10-CM | POA: Diagnosis not present

## 2023-10-02 DIAGNOSIS — R972 Elevated prostate specific antigen [PSA]: Secondary | ICD-10-CM | POA: Diagnosis not present

## 2023-10-17 DIAGNOSIS — D485 Neoplasm of uncertain behavior of skin: Secondary | ICD-10-CM | POA: Diagnosis not present

## 2023-10-17 DIAGNOSIS — L821 Other seborrheic keratosis: Secondary | ICD-10-CM | POA: Diagnosis not present

## 2023-10-17 DIAGNOSIS — L814 Other melanin hyperpigmentation: Secondary | ICD-10-CM | POA: Diagnosis not present

## 2023-10-24 DIAGNOSIS — L602 Onychogryphosis: Secondary | ICD-10-CM | POA: Diagnosis not present

## 2023-10-24 DIAGNOSIS — M79672 Pain in left foot: Secondary | ICD-10-CM | POA: Diagnosis not present

## 2023-10-24 DIAGNOSIS — L600 Ingrowing nail: Secondary | ICD-10-CM | POA: Diagnosis not present

## 2023-10-24 DIAGNOSIS — M79671 Pain in right foot: Secondary | ICD-10-CM | POA: Diagnosis not present

## 2023-10-24 DIAGNOSIS — M2041 Other hammer toe(s) (acquired), right foot: Secondary | ICD-10-CM | POA: Diagnosis not present

## 2023-10-24 DIAGNOSIS — M2042 Other hammer toe(s) (acquired), left foot: Secondary | ICD-10-CM | POA: Diagnosis not present

## 2023-12-25 DIAGNOSIS — R7301 Impaired fasting glucose: Secondary | ICD-10-CM | POA: Diagnosis not present

## 2023-12-25 DIAGNOSIS — N183 Chronic kidney disease, stage 3 unspecified: Secondary | ICD-10-CM | POA: Diagnosis not present

## 2023-12-25 DIAGNOSIS — E875 Hyperkalemia: Secondary | ICD-10-CM | POA: Diagnosis not present

## 2023-12-25 DIAGNOSIS — E7849 Other hyperlipidemia: Secondary | ICD-10-CM | POA: Diagnosis not present

## 2023-12-31 DIAGNOSIS — N401 Enlarged prostate with lower urinary tract symptoms: Secondary | ICD-10-CM | POA: Diagnosis not present

## 2023-12-31 DIAGNOSIS — I10 Essential (primary) hypertension: Secondary | ICD-10-CM | POA: Diagnosis not present

## 2023-12-31 DIAGNOSIS — F411 Generalized anxiety disorder: Secondary | ICD-10-CM | POA: Diagnosis not present

## 2023-12-31 DIAGNOSIS — M109 Gout, unspecified: Secondary | ICD-10-CM | POA: Diagnosis not present

## 2023-12-31 DIAGNOSIS — Z6822 Body mass index (BMI) 22.0-22.9, adult: Secondary | ICD-10-CM | POA: Diagnosis not present

## 2024-01-09 DIAGNOSIS — L738 Other specified follicular disorders: Secondary | ICD-10-CM | POA: Diagnosis not present

## 2024-01-09 DIAGNOSIS — L821 Other seborrheic keratosis: Secondary | ICD-10-CM | POA: Diagnosis not present

## 2024-01-09 DIAGNOSIS — L814 Other melanin hyperpigmentation: Secondary | ICD-10-CM | POA: Diagnosis not present

## 2024-01-09 DIAGNOSIS — D485 Neoplasm of uncertain behavior of skin: Secondary | ICD-10-CM | POA: Diagnosis not present

## 2024-01-09 DIAGNOSIS — Z08 Encounter for follow-up examination after completed treatment for malignant neoplasm: Secondary | ICD-10-CM | POA: Diagnosis not present

## 2024-01-09 DIAGNOSIS — Z85828 Personal history of other malignant neoplasm of skin: Secondary | ICD-10-CM | POA: Diagnosis not present

## 2024-01-17 DIAGNOSIS — R972 Elevated prostate specific antigen [PSA]: Secondary | ICD-10-CM | POA: Diagnosis not present

## 2024-01-17 DIAGNOSIS — N401 Enlarged prostate with lower urinary tract symptoms: Secondary | ICD-10-CM | POA: Diagnosis not present

## 2024-01-17 DIAGNOSIS — R339 Retention of urine, unspecified: Secondary | ICD-10-CM | POA: Diagnosis not present

## 2024-01-17 DIAGNOSIS — N390 Urinary tract infection, site not specified: Secondary | ICD-10-CM | POA: Diagnosis not present

## 2024-01-31 DIAGNOSIS — Z23 Encounter for immunization: Secondary | ICD-10-CM | POA: Diagnosis not present

## 2024-02-20 DIAGNOSIS — Z23 Encounter for immunization: Secondary | ICD-10-CM | POA: Diagnosis not present
